# Patient Record
Sex: Female | Born: 1985 | Race: White | Hispanic: No | Marital: Married | Smoking: Never smoker
Health system: Southern US, Community
[De-identification: ages and names within clinical notes are randomized; demographics above are authoritative.]

## PROBLEM LIST (undated history)

## (undated) DIAGNOSIS — I10 Essential (primary) hypertension: Secondary | ICD-10-CM

## (undated) DIAGNOSIS — J45909 Unspecified asthma, uncomplicated: Secondary | ICD-10-CM

## (undated) DIAGNOSIS — F1010 Alcohol abuse, uncomplicated: Secondary | ICD-10-CM

## (undated) DIAGNOSIS — F419 Anxiety disorder, unspecified: Secondary | ICD-10-CM

## (undated) DIAGNOSIS — R7303 Prediabetes: Secondary | ICD-10-CM

## (undated) DIAGNOSIS — F988 Other specified behavioral and emotional disorders with onset usually occurring in childhood and adolescence: Secondary | ICD-10-CM

## (undated) DIAGNOSIS — F32A Depression, unspecified: Secondary | ICD-10-CM

## (undated) DIAGNOSIS — E669 Obesity, unspecified: Secondary | ICD-10-CM

## (undated) DIAGNOSIS — K219 Gastro-esophageal reflux disease without esophagitis: Secondary | ICD-10-CM

## (undated) DIAGNOSIS — K5900 Constipation, unspecified: Secondary | ICD-10-CM

## (undated) DIAGNOSIS — R87619 Unspecified abnormal cytological findings in specimens from cervix uteri: Secondary | ICD-10-CM

## (undated) HISTORY — PX: WISDOM TOOTH EXTRACTION: SHX21

## (undated) HISTORY — DX: Other specified behavioral and emotional disorders with onset usually occurring in childhood and adolescence: F98.8

## (undated) HISTORY — DX: Constipation, unspecified: K59.00

## (undated) HISTORY — DX: Anxiety disorder, unspecified: F41.9

## (undated) HISTORY — DX: Alcohol abuse, uncomplicated: F10.10

## (undated) HISTORY — DX: Unspecified asthma, uncomplicated: J45.909

## (undated) HISTORY — DX: Unspecified abnormal cytological findings in specimens from cervix uteri: R87.619

## (undated) HISTORY — PX: MOUTH SURGERY: SHX715

## (undated) HISTORY — DX: Depression, unspecified: F32.A

## (undated) HISTORY — DX: Essential (primary) hypertension: I10

## (undated) HISTORY — DX: Gastro-esophageal reflux disease without esophagitis: K21.9

## (undated) HISTORY — DX: Obesity, unspecified: E66.9

## (undated) HISTORY — DX: Prediabetes: R73.03

---

## 2015-06-11 HISTORY — PX: LEEP: SHX91

## 2015-06-21 ENCOUNTER — Ambulatory Visit (INDEPENDENT_AMBULATORY_CARE_PROVIDER_SITE_OTHER): Payer: 59 | Admitting: Obstetrics and Gynecology

## 2015-06-21 ENCOUNTER — Encounter: Payer: Self-pay | Admitting: Obstetrics and Gynecology

## 2015-06-21 VITALS — BP 130/80 | HR 72 | Resp 14 | Ht 66.5 in | Wt 183.0 lb

## 2015-06-21 DIAGNOSIS — Z124 Encounter for screening for malignant neoplasm of cervix: Secondary | ICD-10-CM

## 2015-06-21 DIAGNOSIS — Z01419 Encounter for gynecological examination (general) (routine) without abnormal findings: Secondary | ICD-10-CM | POA: Diagnosis not present

## 2015-06-21 DIAGNOSIS — Z3041 Encounter for surveillance of contraceptive pills: Secondary | ICD-10-CM | POA: Diagnosis not present

## 2015-06-21 MED ORDER — ENSKYCE 0.15-30 MG-MCG PO TABS: 1.0000 | ORAL_TABLET | Freq: Every day | ORAL | Status: DC

## 2015-06-21 NOTE — Progress Notes (Signed)
Patient ID: Brandi Davies, female   DOB: 1986-01-25, 30 y.o.   MRN: [id number] 30 y.o. G0P0000 MarriedCaucasianF here for annual exam.  She does state she has cramps for 1/2-1 day, helped with ibuprofen. Considering conception in the next 1-1.5 years. Sexually active, no regular pain. Occasionally gets pain in her RLQ, prior ultrasound was normal. It started as a teen, it has become much less frequent, can't remember the last time it happened.  Period Cycle (Days): 28 Period Duration (Days): 4-7 days Period Pattern: Regular Menstrual Flow: Moderate, Light Menstrual Control: Tampon Dysmenorrhea: None  Patient's last menstrual period was 05/29/2015.          Sexually active: Yes.    The current method of family planning is OCP (estrogen/progesterone).    Exercising: Yes.    cardio and light weights Smoker:  no  Health Maintenance: Pap:  2013 WNL per patient  History of abnormal Pap:  no TDaP:  2008 Gardasil: no    reports that she has never smoked. She has never used smokeless tobacco. She reports that she drinks about 4.8 oz of alcohol per week. She reports that she does not use illicit drugs. 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 Married for 1.5 years.   Past Medical History  Diagnosis Date  . Anxiety     Past Surgical History  Procedure Laterality Date  . Wisdom tooth extraction    . Mouth surgery      Current Outpatient Prescriptions  Medication Sig Dispense Refill  . ALBUTEROL SULFATE HFA IN Inhale into the lungs.    Gillian Scarce. ENSKYCE 0.15-30 MG-MCG tablet      No current facility-administered medications for this visit.    Family History  Problem Relation Age of Onset  . Stomach cancer Maternal Grandmother   . Stomach cancer Maternal Grandfather   . Stroke Paternal Grandmother   . Heart disease Paternal Grandmother   . Heart attack Paternal Grandmother   . Heart attack Paternal Grandfather   Mom died of Internal Bleeding, when the patient was [id number], she wasn't in her life.   Review of  Systems  Constitutional: Negative.   HENT: Negative.   Eyes: Negative.   Respiratory: Negative.   Cardiovascular: Negative.   Gastrointestinal: Negative.   Endocrine: Negative.   Genitourinary: Negative.   Musculoskeletal: Negative.   Skin: Negative.   Allergic/Immunologic: Negative.   Neurological: Negative.   Psychiatric/Behavioral: Negative.     Exam:   BP 130/80 mmHg  Pulse 72  Resp 14  Ht 5' 6.5" (1.689 m)  Wt 183 lb (83.008 kg)  BMI 29.10 kg/m2  LMP 05/29/2015  Weight change: @WEIGHTCHANGE @ Height:   Height: 5' 6.5" (168.9 cm)  Ht Readings from Last 3 Encounters:  06/21/15 5' 6.5" (1.689 m)    General appearance: alert, cooperative and appears stated age Head: Normocephalic, without obvious abnormality, atraumatic Neck: no adenopathy, supple, symmetrical, trachea midline and thyroid normal to inspection and palpation Lungs: clear to auscultation bilaterally Breasts: normal appearance, no masses or tenderness Heart: regular rate and rhythm Abdomen: soft, non-tender; bowel sounds normal; no masses,  no organomegaly Extremities: extremities normal, atraumatic, no cyanosis or edema Skin: Skin color, texture, turgor normal. No rashes or lesions Lymph nodes: Cervical, supraclavicular, and axillary nodes normal. No abnormal inguinal nodes palpated Neurologic: Grossly normal   Pelvic: External genitalia:  no lesions              Urethra:  normal appearing urethra with no masses, tenderness or lesions  Bartholins and Skenes: normal                 Vagina: normal appearing vagina with normal color and discharge, no lesions              Cervix: no lesions               Bimanual Exam:  Uterus:  normal size, contour, position, consistency, mobility, non-tender              Adnexa: no mass, fullness, tenderness               Rectovaginal: Confirms               Anus:  normal sphincter tone, no lesions  Chaperone was present for exam.  A:  Well Woman with  normal exam  Contraception, happy with OCP's  Considering pregnancy in the next 1-1.5 years. No FH of genetic or chromosomal abnormalities  P:   Pap  Continue OCP's  Discussed preconception health, information given  Discussed breast self exam  Discussed calcium and vit d  Declines lipids  She will confirm she has had her TDAP

## 2015-06-21 NOTE — Patient Instructions (Addendum)
EXERCISE AND DIET:  We recommended that you start or continue a regular exercise program for good health. Regular exercise means any activity that makes your heart beat faster and makes you sweat.  We recommend exercising at least 30 minutes per day at least 3 days a week, preferably 4 or 5.  We also recommend a diet low in fat and sugar.  Inactivity, poor dietary choices and obesity can cause diabetes, heart attack, stroke, and kidney damage, among others.    ALCOHOL AND SMOKING:  Women should limit their alcohol intake to no more than 7 drinks/beers/glasses of wine (combined, not each!) per week. Moderation of alcohol intake to this level decreases your risk of breast cancer and liver damage. And of course, no recreational drugs are part of a healthy lifestyle.  And absolutely no smoking or even second hand smoke. Most people know smoking can cause heart and lung diseases, but did you know it also contributes to weakening of your bones? Aging of your skin?  Yellowing of your teeth and nails?  CALCIUM AND VITAMIN D:  Adequate intake of calcium and Vitamin D are recommended.  The recommendations for exact amounts of these supplements seem to change often, but generally speaking 600 mg of calcium (either carbonate or citrate) and 800 units of Vitamin D per day seems prudent. Certain women may benefit from higher intake of Vitamin D.  If you are among these women, your doctor will have told you during your visit.    PAP SMEARS:  Pap smears, to check for cervical cancer or precancers,  have traditionally been done yearly, although recent scientific advances have shown that most women can have pap smears less often.  However, every woman still should have a physical exam from her gynecologist every year. It will include a breast check, inspection of the vulva and vagina to check for abnormal growths or skin changes, a visual exam of the cervix, and then an exam to evaluate the size and shape of the uterus and  ovaries.  And after 28 years of age, a rectal exam is indicated to check for rectal cancers. We will also provide age appropriate advice regarding health maintenance, like when you should have certain vaccines, screening for sexually transmitted diseases, bone density testing, colonoscopy, mammograms, etc.    Check if you have had a TDAP

## 2015-06-26 LAB — IPS PAP TEST WITH REFLEX TO HPV

## 2015-06-29 ENCOUNTER — Telehealth: Payer: Self-pay | Admitting: Emergency Medicine

## 2015-06-29 DIAGNOSIS — R87613 High grade squamous intraepithelial lesion on cytologic smear of cervix (HGSIL): Secondary | ICD-10-CM

## 2015-06-29 NOTE — Telephone Encounter (Signed)
-----   Message from Romualdo Bolk, MD sent at 06/27/2015 10:37 AM EST ----- Please inform and set her up for a colposcopy with me.

## 2015-06-29 NOTE — Telephone Encounter (Signed)
Message left to return call to [place] at [phone].   Patient on OCP-Ensykce. LMP at last office visit 05/27/15. 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

## 2015-06-30 ENCOUNTER — Ambulatory Visit: Payer: 59 | Admitting: Obstetrics and Gynecology

## 2015-07-03 NOTE — Telephone Encounter (Signed)
Call again to patient.  Results of pap smear and high grade cells noted on pap smear discussed with patient. Questions answered.  She is agreeable to scheduling colposcopy. Scheduled for 07/10/15 at 0815 (patient requests early morning appointment) Brief description of procedure given to patient.  Colposcopy pre-procedure instructions given. Discussed menses and need to not have any bleeding on day of appointment, advised to call to reschedule if starts cycle. On OCP, LMP last week per patient.  Make sure to eat a meal and hydrate before appointment.  Advised 800 mg of Motrin PO with food one hour prior to appointment.   Patient verbalized understanding of preprocedure instructions and cancellation policy and will call to reschedule if will be on menses or has any concerns regarding pregnancy.  Patient is advised she will be contacted with insurance coverage information.   cc Harland Dingwall for insurance pre-certification  and patient contact.   Routing to provider for final review. Patient agreeable to disposition. Will close encounter.

## 2015-07-10 ENCOUNTER — Ambulatory Visit (INDEPENDENT_AMBULATORY_CARE_PROVIDER_SITE_OTHER): Payer: 59 | Admitting: Obstetrics and Gynecology

## 2015-07-10 ENCOUNTER — Encounter: Payer: Self-pay | Admitting: Obstetrics and Gynecology

## 2015-07-10 VITALS — BP 130/80 | HR 84 | Resp 16 | Wt 181.0 lb

## 2015-07-10 DIAGNOSIS — Z01812 Encounter for preprocedural laboratory examination: Secondary | ICD-10-CM

## 2015-07-10 DIAGNOSIS — R87613 High grade squamous intraepithelial lesion on cytologic smear of cervix (HGSIL): Secondary | ICD-10-CM | POA: Diagnosis not present

## 2015-07-10 LAB — POCT URINE PREGNANCY: Preg Test, Ur: NEGATIVE

## 2015-07-10 NOTE — Progress Notes (Signed)
Patient ID: Brandi Davies, female   DOB: 09-20-1985, 30 y.o.   MRN: [id number] GYNECOLOGY  VISIT   HPI: 30 y.o.   Married  Caucasian  female   G0P0000 with Patient's last menstrual period was 06/25/2015.   here for colposcopy. 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  She is married, with her husband x 6.5 years. Last pap in 2013 was normal, no h/o abnormal paps.   GYNECOLOGIC HISTORY: Patient's last menstrual period was 06/25/2015. Contraception:OCP Menopausal hormone therapy: None         OB History    Gravida Para Term Preterm AB TAB SAB Ectopic Multiple Living           There are no active problems to display for this patient.   Past Medical History  Diagnosis Date  . Anxiety   . Asthma     Past Surgical History  Procedure Laterality Date  . Wisdom tooth extraction    . Mouth surgery      Current Outpatient Prescriptions  Medication Sig Dispense Refill  . ALBUTEROL SULFATE HFA IN Inhale into the lungs.    . ENSKYCE 0.15-30 MG-MCG tablet Take 1 tablet by mouth daily. 1 Package 0   No current facility-administered medications for this visit.     ALLERGIES: Sulfa antibiotics  Family History  Problem Relation Age of Onset  . Stomach cancer Maternal Grandmother   . Stomach cancer Maternal Grandfather   . Stroke Paternal Grandmother   . Heart disease Paternal Grandmother   . Heart attack Paternal Grandmother   . Heart attack Paternal Grandfather     Social History   Social History  . Marital Status: Married    Spouse Name: N/A  . Number of Children: N/A  . Years of Education: N/A   Occupational History  . Not on file.   Social History Main Topics  . Smoking status: Never Smoker   . Smokeless tobacco: Never Used  . Alcohol Use: 4.8 oz/week    8 Standard drinks or equivalent per week  . Drug Use: No  . Sexual Activity:    Partners: Male   Other Topics Concern  . Not on file   Social History Narrative    Review of Systems   Constitutional: Negative.   HENT: Negative.   Eyes: Negative.   Respiratory: Negative.   Cardiovascular: Negative.   Gastrointestinal: Negative.   Genitourinary: Negative.   Musculoskeletal: Negative.   Skin: Negative.   Neurological: Negative.   Endo/Heme/Allergies: Negative.   Psychiatric/Behavioral: The patient is nervous/anxious.     PHYSICAL EXAMINATION:    BP 130/80 mmHg  Pulse 84  Resp 16  Wt 181 lb (82.101 kg)  LMP 06/25/2015    General appearance: alert, cooperative and appears stated age  Pelvic: External genitalia:  no lesions              Urethra:  normal appearing urethra with no masses, tenderness or lesions              Bartholins and Skenes: normal                 Vagina: normal appearing vagina with normal color and discharge, no lesions              Cervix: no lesions  Colposcopy: satisfactory, acetowhite changes anteriorly and posteriorly, some vessel changes posteriorly. Cervical biopsies taken at 6 and 11 o'clock. ECC done. Negative lugols of the vagina.  Chaperone was present for exam.  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 All of the patient's questions were answered.  An After Visit Summary was printed and given to the patient.  In addition to the colposcopy and reviewing her abnormal pap smear, over 10 minutes was spent in counseling.

## 2015-07-10 NOTE — Patient Instructions (Signed)

## 2015-07-14 ENCOUNTER — Telehealth: Payer: Self-pay | Admitting: Emergency Medicine

## 2015-07-14 DIAGNOSIS — R87613 High grade squamous intraepithelial lesion on cytologic smear of cervix (HGSIL): Secondary | ICD-10-CM

## 2015-07-14 NOTE — Telephone Encounter (Signed)
Spoke with patient. She is given results from pathology and High grade cell changes that require LEEP with Dr. Oscar La.  She verbalizes understanding of results and importance of scheduling procedure.  She wishes to hear about insurance coverage of procedure prior to scheduling.  Advised will call her with final result from remaining biopsy as well as insurance coverage of LEEP.  Patient LMP 06/25/15.  On oral contraception.   Order placed for LEEP pre-certification.  Routing to Dr. Oscar La and Harland Dingwall

## 2015-07-14 NOTE — Telephone Encounter (Signed)
Message left to return call to [name] at [phone].    

## 2015-07-14 NOTE — Telephone Encounter (Signed)
-----   Message from Romualdo Bolk, MD sent at 07/12/2015  4:24 PM EST ----- Please inform the patient that one of the biopsies is pending. The other one is cw HSIL. Please set her up for a leep with me. The other biopsy should be back in the next 1-2 days. Don't schedule it prior to next week.

## 2015-07-18 NOTE — Telephone Encounter (Signed)
Patient spoke with business office and is now ready to schedule procedure. Menses due next week. Call to patient, left message to call back.

## 2015-07-19 NOTE — Telephone Encounter (Signed)
-----   Message from Romualdo Bolk, MD sent at 07/17/2015  1:01 PM EST ----- Please inform the patient that her final biopsy showed HPV effect, but no HSIL. She still needs a leep based on her prior procedure.

## 2015-07-19 NOTE — Telephone Encounter (Signed)
Patient advised of final biopsy results. Verbalized understanding.  She is ready to schedule LEEP procedure in office.  She states that she and Dr. Oscar La discussed possibly using Ativan in office prior to procedure. Advised patient can do so, but will need to arrive one hour earlier than scheduled time to allow for signing consent and then can take medication. She will also need a driver. Patient will discuss this with her husband and call back if she will have a ride and to make plans for pre-medication. Advised that Dr. Oscar La will use local anesthetic as well on cervix.   Patient cannot schedule appointment this week due to her schedule, plans to start her cycle on Sunday 07/23/15 and usually lasts 7-8 days. She is scheduled for procedure on 08/03/15 at 1300 with Dr. Oscar La. Pre-procedure instructions given. Motrin 800 mg PO one hour before appointment with food and water.   Patient would like to have her benefits rechecked to ensure only office visit copay is required.  Advised would send her request to insurance.   Patient agreeable and verbalized understanding of all instructions and plan of care.  Routing to provider for final review. Patient agreeable to disposition. Will close encounter.

## 2015-08-02 ENCOUNTER — Telehealth: Payer: Self-pay | Admitting: Emergency Medicine

## 2015-08-02 NOTE — Telephone Encounter (Signed)
Patient returned call. She will arrive at 12:30 for appointment tomorrow.

## 2015-08-02 NOTE — Telephone Encounter (Signed)
Call to patient. Left message with details okay per designated party release form. Asking if she can arrive tomorrow at 1230 for check in for her procedure appointment.  Asked patient to please call back and confirm.  If patient calls back please confirm that patient will arrive at 1230.

## 2015-08-03 ENCOUNTER — Encounter: Payer: Self-pay | Admitting: Obstetrics and Gynecology

## 2015-08-03 ENCOUNTER — Ambulatory Visit (INDEPENDENT_AMBULATORY_CARE_PROVIDER_SITE_OTHER): Payer: 59 | Admitting: Obstetrics and Gynecology

## 2015-08-03 VITALS — BP 144/82 | HR 72 | Resp 16 | Wt 180.0 lb

## 2015-08-03 DIAGNOSIS — Z01812 Encounter for preprocedural laboratory examination: Secondary | ICD-10-CM

## 2015-08-03 DIAGNOSIS — N762 Acute vulvitis: Secondary | ICD-10-CM | POA: Diagnosis not present

## 2015-08-03 DIAGNOSIS — R87613 High grade squamous intraepithelial lesion on cytologic smear of cervix (HGSIL): Secondary | ICD-10-CM | POA: Diagnosis not present

## 2015-08-03 LAB — POCT URINE PREGNANCY: Preg Test, Ur: NEGATIVE

## 2015-08-03 MED ORDER — BETAMETHASONE VALERATE 0.1 % EX OINT: TOPICAL_OINTMENT | CUTANEOUS | Status: DC

## 2015-08-03 NOTE — Progress Notes (Signed)
Patient ID: Brandi Davies, female   DOB: [birth year], 30 y.o.   MRN: [id number] GYNECOLOGY  VISIT   HPI: 30 y.o.   Married  Caucasian  female   G0P0000 with Patient's last menstrual period was 07/30/2015.   here for  A LEEP. The patients recent colpo with CIN 2 (at minimum), satisfactory, few atypical cells on ECC After lugols was placed the patient c/o long term issues with vulvar irritation and fissures off and on, minimal itching. Recent pap without signs of vaginitis.  GYNECOLOGIC HISTORY: Patient's last menstrual period was 07/30/2015. Contraception:OCP Menopausal hormone therapy: None        OB History    Gravida Para Term Preterm AB TAB SAB Ectopic Multiple Living           There are no active problems to display for this patient.   Past Medical History  Diagnosis Date  . Anxiety   . Asthma     Past Surgical History  Procedure Laterality Date  . Wisdom tooth extraction    . Mouth surgery      Current Outpatient Prescriptions  Medication Sig Dispense Refill  . ALBUTEROL SULFATE HFA IN Inhale into the lungs.    . ENSKYCE 0.15-30 MG-MCG tablet Take 1 tablet by mouth daily. 1 Package 0  . betamethasone valerate ointment (VALISONE) 0.1 % Apply a pea sized amount topically BID for 1-2 weeks. 15 g 0   No current facility-administered medications for this visit.     ALLERGIES: Sulfa antibiotics  Family History  Problem Relation Age of Onset  . Stomach cancer Maternal Grandmother   . Stomach cancer Maternal Grandfather   . Stroke Paternal Grandmother   . Heart disease Paternal Grandmother   . Heart attack Paternal Grandmother   . Heart attack Paternal Grandfather     Social History   Social History  . Marital Status: Married    Spouse Name: N/A  . Number of Children: N/A  . Years of Education: N/A   Occupational History  . Not on file.   Social History Main Topics  . Smoking status: Never Smoker   . Smokeless tobacco: Never Used  .  Alcohol Use: 4.8 oz/week    8 Standard drinks or equivalent per week  . Drug Use: No  . Sexual Activity:    Partners: Male   Other Topics Concern  . Not on file   Social History Narrative    Review of Systems  Constitutional: Negative.   HENT: Negative.   Eyes: Negative.   Respiratory: Negative.   Cardiovascular: Negative.   Gastrointestinal: Negative.   Genitourinary: Negative.   Musculoskeletal: Negative.   Skin: Negative.   Neurological: Negative.   Endo/Heme/Allergies: Negative.   Psychiatric/Behavioral: The patient is nervous/anxious.     PHYSICAL EXAMINATION:    BP 144/82 mmHg  Pulse 72  Resp 16  Wt 180 lb (81.647 kg)  LMP 07/30/2015    General appearance: alert, cooperative and appears stated age  Procedure: The patient was counseled as to the risks of the procedure, including: infection, bleeding, future pregnancy risks and cervical stenosis. A consent form was signed.  Colposcopy was performed, satisfactory. Lugols solution was placed on the cervix and a paracervical block was injected using 1% lidocaine with epinephrine. The 1.5 cm loop was used to remove a portion of the exocervix taking care to get the entire transformation zone.  The settings were 55 cut, 50 coag with a blend  of 1.  An ECC was performed. The cautery ball was then used to cauterize the base of the biopsy site and monsels were placed. The patient tolerated the procedure well.   After lugols was placed the patient c/o long term issues with vulvar irritation and fissures off and on, minimal itching. Lugols and acetic acid already used. No abnormal d/c was noted.  Vulva: slight whitening of the skin surrounding the clitoris, fissure noted right above the clitoris.   Chaperone was present for exam.  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

## 2015-08-10 ENCOUNTER — Telehealth: Payer: Self-pay | Admitting: Obstetrics and Gynecology

## 2015-08-10 ENCOUNTER — Encounter: Payer: Self-pay | Admitting: Obstetrics and Gynecology

## 2015-08-10 NOTE — Telephone Encounter (Signed)
Called patient, left a message to call back and speak to myself or one of the phone nurses. 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 After the ECC is done, more tissue is treated with the cautery. She needs a f/u pap and ECC in 4 months.

## 2015-08-10 NOTE — Telephone Encounter (Signed)
Patient talked personally with Dr. Oscar La this afternoon.  Dr. Oscar La addressed questions. Will close encounter.

## 2015-08-10 NOTE — Telephone Encounter (Signed)
Spoke with the patient, reviewed results. Recommended pap and ECC in 4 months.

## 2015-08-31 ENCOUNTER — Encounter: Payer: Self-pay | Admitting: Obstetrics and Gynecology

## 2015-08-31 ENCOUNTER — Ambulatory Visit (INDEPENDENT_AMBULATORY_CARE_PROVIDER_SITE_OTHER): Payer: 59 | Admitting: Obstetrics and Gynecology

## 2015-08-31 VITALS — BP 132/80 | HR 84 | Resp 16 | Wt 183.0 lb

## 2015-08-31 DIAGNOSIS — N762 Acute vulvitis: Secondary | ICD-10-CM

## 2015-08-31 DIAGNOSIS — D069 Carcinoma in situ of cervix, unspecified: Secondary | ICD-10-CM | POA: Diagnosis not present

## 2015-08-31 DIAGNOSIS — Z9889 Other specified postprocedural states: Secondary | ICD-10-CM | POA: Diagnosis not present

## 2015-08-31 NOTE — Progress Notes (Signed)
Patient ID: Brandi Davies, female   DOB: June 28, 1985, 30 y.o.   MRN: [id number] GYNECOLOGY  VISIT   HPI: 30 y.o.   Married  Caucasian  female   G0P0000 with Patient's last menstrual period was 08/23/2015.   here for recheck Vulvitis and f/u from a leep. She has stopped bleeding. She has a long h/o intermittent vulvar irritation and pruritus. She was given a steroid ointment at her last visit, she has been under stress and never ended up filling the script. She denies any abnormal bleeding, she had some bleeding and funny d/c for a few weeks, started a week after the leep.  GYNECOLOGIC HISTORY: Patient's last menstrual period was 08/23/2015. Contraception:OCP Menopausal hormone therapy: None         OB History    Gravida Para Term Preterm AB TAB SAB Ectopic Multiple Living   0 0 0 0 0 0 0 0 0 0          There are no active problems to display for this patient.   Past Medical History  Diagnosis Date  . Anxiety   . Asthma     Past Surgical History  Procedure Laterality Date  . Wisdom tooth extraction    . Mouth surgery      Current Outpatient Prescriptions  Medication Sig Dispense Refill  . ALBUTEROL SULFATE HFA IN Inhale into the lungs.    . ENSKYCE 0.15-30 MG-MCG tablet Take 1 tablet by mouth daily. 1 Package 0  . betamethasone valerate ointment (VALISONE) 0.1 % Apply a pea sized amount topically BID for 1-2 weeks. (Patient not taking: Reported on 08/31/2015) 15 g 0   No current facility-administered medications for this visit.     ALLERGIES: Sulfa antibiotics  Family History  Problem Relation Age of Onset  . Stomach cancer Maternal Grandmother   . Stomach cancer Maternal Grandfather   . Stroke Paternal Grandmother   . Heart disease Paternal Grandmother   . Heart attack Paternal Grandmother   . Heart attack Paternal Grandfather     Social History   Social History  . Marital Status: Married    Spouse Name: N/A  . Number of Children: N/A  . Years of Education: N/A    Occupational History  . Not on file.   Social History Main Topics  . Smoking status: Never Smoker   . Smokeless tobacco: Never Used  . Alcohol Use: 4.8 oz/week    8 Standard drinks or equivalent per week  . Drug Use: No  . Sexual Activity:    Partners: Male   Other Topics Concern  . Not on file   Social History Narrative    Review of Systems  Constitutional: Negative.   HENT: Negative.   Eyes: Negative.   Respiratory: Negative.   Cardiovascular: Negative.   Gastrointestinal: Negative.   Genitourinary: Negative.   Musculoskeletal: Negative.   Skin: Negative.   Neurological: Negative.   Endo/Heme/Allergies: Negative.   Psychiatric/Behavioral: Negative.     PHYSICAL EXAMINATION:    BP 132/80 mmHg  Pulse 84  Resp 16  Wt 183 lb (83.008 kg)  LMP 08/23/2015    General appearance: alert, cooperative and appears stated age  Pelvic: External genitalia:  no lesions, some erythema and fissures above the clitoris and in the posterior fourchette to the perianal area.               Urethra:  normal appearing urethra with no masses, tenderness or lesions  Bartholins and Skenes: normal                 Vagina: normal appearing vagina with a slight increase in yellow, white, thick vaginal discharge              Cervix: Healing well s/p leep. Appears slightly friable  Chaperone was present for exam.  Wet prep: no clue, no trich, ++ wbc KOH: no yeast PH: 4.5   ASSESSMENT S/P LEEP, healing well. She had CIN III with focally + margin and some atypical cells in the ECC Vulvitis, negative vaginal slides    PLAN She is returning for a pap and ECC in 3 more months. We discussed the possible need for a repeat LEEP Discussed vulvar skin care Will send wet prep probe She will start the valisone that was prescribed at her last visit Return if symptoms don't resolve She would like to get pregnant, will continue contraception until after her f/u pap and ECC    An  After Visit Summary was printed and given to the patient.  15+ minutes face to face time of which over 50% was spent in counseling.

## 2015-09-01 LAB — WET PREP BY MOLECULAR PROBE
CANDIDA SPECIES: NEGATIVE
GARDNERELLA VAGINALIS: NEGATIVE
TRICHOMONAS VAG: NEGATIVE

## 2015-10-16 ENCOUNTER — Other Ambulatory Visit: Payer: Self-pay | Admitting: Obstetrics and Gynecology

## 2015-10-16 NOTE — Telephone Encounter (Signed)
Medication refill request: Enskyce Last AEX:  06/21/15 with JJ Next AEX: 06/26/2016 with JJ  Last MMG (if hormonal medication request): n/a Refill authorized: #1 pack with 7 rfs

## 2015-12-21 ENCOUNTER — Encounter: Payer: Self-pay | Admitting: Obstetrics and Gynecology

## 2015-12-21 ENCOUNTER — Ambulatory Visit (INDEPENDENT_AMBULATORY_CARE_PROVIDER_SITE_OTHER): Payer: 59 | Admitting: Obstetrics and Gynecology

## 2015-12-21 VITALS — BP 138/80 | HR 68 | Resp 15 | Wt 189.0 lb

## 2015-12-21 DIAGNOSIS — D069 Carcinoma in situ of cervix, unspecified: Secondary | ICD-10-CM | POA: Diagnosis not present

## 2015-12-21 DIAGNOSIS — Z124 Encounter for screening for malignant neoplasm of cervix: Secondary | ICD-10-CM

## 2015-12-21 DIAGNOSIS — J4520 Mild intermittent asthma, uncomplicated: Secondary | ICD-10-CM

## 2015-12-21 DIAGNOSIS — L293 Anogenital pruritus, unspecified: Secondary | ICD-10-CM

## 2015-12-21 MED ORDER — ALBUTEROL SULFATE HFA 108 (90 BASE) MCG/ACT IN AERS: 2.0000 | INHALATION_SPRAY | Freq: Four times a day (QID) | RESPIRATORY_TRACT | Status: AC | PRN

## 2015-12-21 NOTE — Progress Notes (Addendum)
GYNECOLOGY  VISIT   HPI: 30 y.o.   Married  Caucasian  female   G0P0000 with Patient's last menstrual period was 12/13/2015.   here for follow up PAP and ECC. S/P LEEP in 2/17, CIN III with focally + margin and some atypical cells in the ECC She has sports induced asthma, occasionally gets wheezy in the humid weather. Usually fine. Always has an albuterol inhaler in case she needs it. Requesting a script.  She has gained 5 lbs since her last visit. She has been doing cardio and weights, exercises most days, weights everyday.  She has issues with chronic vulvar irritation. She has been given steroid ointment in the past which she uses prn. She is aware of vulvar skin care recommendations. She states that currently her symptoms are tolerable.   GYNECOLOGIC HISTORY: Patient's last menstrual period was 12/13/2015. Contraception:OCP Menopausal hormone therapy: none        OB History    Gravida Para Term Preterm AB TAB SAB Ectopic Multiple Living           Patient Active Problem List   Diagnosis Date Noted  . CIN III (cervical intraepithelial neoplasia grade III) with severe dysplasia 08/31/2015  . H/O LEEP 08/31/2015    Past Medical History  Diagnosis Date  . Anxiety   . Asthma     Past Surgical History  Procedure Laterality Date  . Wisdom tooth extraction    . Mouth surgery      Current Outpatient Prescriptions  Medication Sig Dispense Refill  . ALBUTEROL SULFATE HFA IN Inhale into the lungs.    . ENSKYCE 0.15-30 MG-MCG tablet TAKE 1 TABLET BY MOUTH DAILY. 28 tablet 7  . albuterol (PROVENTIL HFA;VENTOLIN HFA) 108 (90 Base) MCG/ACT inhaler Inhale 2 puffs into the lungs every 6 (six) hours as needed for wheezing or shortness of breath. 1 Inhaler 2   No current facility-administered medications for this visit.     ALLERGIES: Sulfa antibiotics  Family History  Problem Relation Age of Onset  . Stomach cancer Maternal Grandmother   . Stomach cancer  Maternal Grandfather   . Stroke Paternal Grandmother   . Heart disease Paternal Grandmother   . Heart attack Paternal Grandmother   . Heart attack Paternal Grandfather     Social History   Social History  . Marital Status: Married    Spouse Name: N/A  . Number of Children: N/A  . Years of Education: N/A   Occupational History  . Not on file.   Social History Main Topics  . Smoking status: Never Smoker   . Smokeless tobacco: Never Used  . Alcohol Use: 4.8 oz/week    8 Standard drinks or equivalent per week  . Drug Use: No  . Sexual Activity:    Partners: Male   Other Topics Concern  . Not on file   Social History Narrative    Review of Systems  Constitutional: Negative.   HENT: Negative.   Eyes: Negative.   Respiratory: Negative.   Cardiovascular: Negative.   Gastrointestinal: Negative.   Genitourinary: Negative.   Musculoskeletal: Negative.   Skin: Negative.   Neurological: Negative.   Endo/Heme/Allergies: Negative.   Psychiatric/Behavioral: Negative.     PHYSICAL EXAMINATION:    BP 138/80 mmHg  Pulse 68  Resp 15  Wt 189 lb (85.73 kg)  LMP 12/13/2015    General appearance: alert, cooperative and appears stated age Lungs: clear to ascultation bilaterally  Pelvic: External genitalia:  no lesions, she does have mild erythema of the vulva, small fissure above the clitoris              Urethra:  normal appearing urethra with no masses, tenderness or lesions              Bartholins and Skenes: normal                 Vagina: normal appearing vagina with a slight increase in white vaginal discharge on the walls of the vagina              Cervix: no lesions and healing well               Chaperone was present for exam.  ASSESSMENT H/O LEEP, focally +margin and atypical cells on ECC Difficulty with weight loss Asthma, mild, worsened with exercise and bad humidity. She states it is stable. She doesn't have a primary MD Vulvitis, chronic     PLAN Pap  and ECC, further plan based on results Discussed weight watchers, watching portion size, cutting back on ETOH (she drinks 2 bottles of wine on the weekend), continuing to exercise Will treat with albuterol. If she has worsening asthma, she will need to establish care with a primary MD Wet prep probe sent. Discussed intermittent use of steroid ointment, she can use Vaseline daily     An After Visit Summary was printed and given to the patient.  25 minutes face to face time of which over 50% was spent in counseling.

## 2015-12-22 ENCOUNTER — Telehealth: Payer: Self-pay | Admitting: Emergency Medicine

## 2015-12-22 LAB — WET PREP BY MOLECULAR PROBE
Candida species: NEGATIVE
Gardnerella vaginalis: POSITIVE — AB
TRICHOMONAS VAG: NEGATIVE

## 2015-12-22 LAB — IPS PAP TEST WITH HPV

## 2015-12-22 MED ORDER — METRONIDAZOLE 500 MG PO TABS: 500.0000 mg | ORAL_TABLET | Freq: Two times a day (BID) | ORAL | Status: DC

## 2015-12-22 NOTE — Telephone Encounter (Signed)
Message left to return call to [place] at [phone].    

## 2015-12-22 NOTE — Telephone Encounter (Signed)
-----   Message from Romualdo BolkJill Evelyn Jertson, MD sent at 12/22/2015  8:29 AM EDT ----- Please inform the patient that her vaginitis probe was + for BV and treat with flagyl (either oral or vaginal, her choice), no ETOH while on Flagyl.  Oral: Flagyl 500 mg BID x 7 days, or Vaginal: Metrogel, 1 applicator per vagina q day x 5 days.

## 2015-12-22 NOTE — Telephone Encounter (Signed)
Spoke with patient. Advised of message as seen below from Dr.Jertson. She is agreeable and verbalizes understanding. She would like to take Flagyl at this time. Rx for Flagyl 500 mg BID x 7 days #14 0RF sent to pharmacy on file. ETOH precautions given.  Routing to provider for final review. Patient agreeable to disposition. Will close encounter.

## 2016-05-16 ENCOUNTER — Other Ambulatory Visit: Payer: Self-pay | Admitting: Obstetrics and Gynecology

## 2016-05-16 NOTE — Telephone Encounter (Signed)
Medication refill request: Enskyce Last AEX:  06/21/15 JJ Next AEX: 06/26/16 JJ Last MMG (if hormonal medication request): n/a Refill authorized: 10/16/15 #[id number]. Please advise. Thank you.

## 2016-05-22 ENCOUNTER — Ambulatory Visit (INDEPENDENT_AMBULATORY_CARE_PROVIDER_SITE_OTHER): Payer: 59 | Admitting: Obstetrics and Gynecology

## 2016-05-22 ENCOUNTER — Encounter: Payer: Self-pay | Admitting: Obstetrics and Gynecology

## 2016-05-22 VITALS — BP 142/82 | HR 80 | Resp 16 | Wt 186.0 lb

## 2016-05-22 DIAGNOSIS — N760 Acute vaginitis: Secondary | ICD-10-CM

## 2016-05-22 DIAGNOSIS — N393 Stress incontinence (female) (male): Secondary | ICD-10-CM | POA: Diagnosis not present

## 2016-05-22 DIAGNOSIS — L709 Acne, unspecified: Secondary | ICD-10-CM | POA: Diagnosis not present

## 2016-05-22 DIAGNOSIS — R350 Frequency of micturition: Secondary | ICD-10-CM | POA: Diagnosis not present

## 2016-05-22 LAB — POCT URINALYSIS DIPSTICK
BILIRUBIN UA: NEGATIVE
KETONES UA: NEGATIVE
Leukocytes, UA: NEGATIVE
Nitrite, UA: NEGATIVE
PH UA: 7
PROTEIN UA: NEGATIVE
RBC UA: NEGATIVE
Urobilinogen, UA: NEGATIVE

## 2016-05-22 MED ORDER — DROSPIRENONE-ETHINYL ESTRADIOL 3-0.02 MG PO TABS: 1.0000 | ORAL_TABLET | Freq: Every day | ORAL | 0 refills | Status: DC

## 2016-05-22 NOTE — Progress Notes (Signed)
GYNECOLOGY  VISIT   HPI: 30 y.o.   Married  Caucasian  female   G0P0000 with Patient's last menstrual period was 05/05/2016.   here c/o vaginal discharge and feeling "wet", noticed a small amount of green d/c. Slight itching. She also c/o increased frequency of urination. No dysuria. If her bladder is full and she coughs or sneeze she is can leak some urine, usually small amounts, 1 x was a large amount.  She notices a lump on her labia, hard, noticed it in the last week, not tender. On the left side.  She is c/o worsening acne on her current pill. Cystic. The best week for acne is the week of her cycle.   GYNECOLOGIC HISTORY: Patient's last menstrual period was 05/05/2016. Contraception:OCP Menopausal hormone therapy: none         OB History    Gravida Para Term Preterm AB Living   0 0 0 0 0 0   SAB TAB Ectopic Multiple Live Births   0 0 0 0           Patient Active Problem List   Diagnosis Date Noted  . CIN III (cervical intraepithelial neoplasia grade III) with severe dysplasia 08/31/2015  . H/O LEEP 08/31/2015    Past Medical History:  Diagnosis Date  . Anxiety   . Asthma     Past Surgical History:  Procedure Laterality Date  . MOUTH SURGERY    . WISDOM TOOTH EXTRACTION      Current Outpatient Prescriptions  Medication Sig Dispense Refill  . albuterol (PROVENTIL HFA;VENTOLIN HFA) 108 (90 Base) MCG/ACT inhaler Inhale 2 puffs into the lungs every 6 (six) hours as needed for wheezing or shortness of breath. 1 Inhaler 2  . ENSKYCE 0.15-30 MG-MCG tablet TAKE 1 TABLET BY MOUTH  DAILY 84 tablet 0   No current facility-administered medications for this visit.      ALLERGIES: Sulfa antibiotics  Family History  Problem Relation Age of Onset  . Stomach cancer Maternal Grandmother   . Stomach cancer Maternal Grandfather   . Stroke Paternal Grandmother   . Heart disease Paternal Grandmother   . Heart attack Paternal Grandmother   . Heart attack Paternal Grandfather      Social History   Social History  . Marital status: Married    Spouse name: N/A  . Number of children: N/A  . Years of education: N/A   Occupational History  . Not on file.   Social History Main Topics  . Smoking status: Never Smoker  . Smokeless tobacco: Never Used  . Alcohol use 4.8 oz/week    8 Standard drinks or equivalent per week  . Drug use: No  . Sexual activity: Yes    Partners: Male   Other Topics Concern  . Not on file   Social History Narrative  . No narrative on file    Review of Systems  Constitutional: Negative.   HENT: Negative.   Eyes: Negative.   Respiratory: Negative.   Cardiovascular: Negative.   Gastrointestinal: Negative.   Genitourinary:       Vaginal discharge Vaginal "wetness"   Musculoskeletal: Negative.   Skin: Negative.   Neurological: Negative.   Endo/Heme/Allergies: Negative.   Psychiatric/Behavioral: Negative.     PHYSICAL EXAMINATION:    BP (!) 142/82 (BP Location: Right Arm, Patient Position: Sitting, Cuff Size: Normal)   Pulse 80   Resp 16   Wt 186 lb (84.4 kg)   LMP 05/05/2016   BMI 29.57 kg/m  General appearance: alert, cooperative and appears stated age  Pelvic: External genitalia:  no lesions, mild erythema              Urethra:  normal appearing urethra with no masses, tenderness or lesions              Bartholins and Skenes: normal                 Vagina: normal appearing vagina with normal color and discharge, no lesions              Cervix: no lesions              Perianal: +erythema, some whitening, some fissures   Chaperone was present for exam.  Wet prep: no clue, no trich, no wbc KOH: no yeast PH: 4.5   ASSESSMENT Vaginitis Perianal dermatitis Urinary frequency Worsening acne on OCP's New GSI   PLAN Urine for ua, c&s Information on kegels given Change to yaz for OCP's She has valisone ointment at home, recommended she use that 2 x a day for the next week Wet prep probe sent We  discussed causes of BV, she has a h/o prior infections. Discussed condoms to help decrease recurrence     An After Visit Summary was printed and given to the patient.  25 minutes face to face time of which over 50% was spent in counseling.

## 2016-05-22 NOTE — Addendum Note (Signed)
Addended by: Michell Heinrich'NEAL, SUMMER D on: 05/22/2016 12:24 PM   Modules accepted: Orders

## 2016-05-22 NOTE — Patient Instructions (Signed)
Kegel Exercises  The goal of Kegel exercises is to isolate and exercise your pelvic floor muscles. These muscles act as a hammock that supports the rectum, vagina, small intestine, and uterus. As the muscles weaken, the hammock sags and these organs are displaced from their normal positions. Kegel exercises can strengthen your pelvic floor muscles and help you to improve bladder and bowel control, improve sexual response, and help reduce many problems and some discomfort during pregnancy. Kegel exercises can be done anywhere and at any time.  HOW TO PERFORM KEGEL EXERCISES  1. Locate your pelvic floor muscles. To do this, squeeze (contract) the muscles that you use when you try to stop the flow of urine. You will feel a tightness in the vaginal area (women) and a tight lift in the rectal area (men and women).  2. When you begin, contract your pelvic muscles tight for 2-5 seconds, then relax them for 2-5 seconds. This is one set. Do 4-5 sets with a short pause in between.  3. Contract your pelvic muscles for 8-10 seconds, then relax them for 8-10 seconds. Do 4-5 sets. If you cannot contract your pelvic muscles for 8-10 seconds, try 5-7 seconds and work your way up to 8-10 seconds. Your goal is 4-5 sets of 10 contractions each day.  Keep your stomach, buttocks, and legs relaxed during the exercises. Perform sets of both short and long contractions. Vary your positions. Perform these contractions 3-4 times per day. Perform sets while you are:    · Lying in bed in the morning.  · Standing at lunch.  · Sitting in the late afternoon.  · Lying in bed at night.   You should do 40-50 contractions per day. Do not perform more Kegel exercises per day than recommended. Overexercising can cause muscle fatigue. Continue these exercises for for at least 15-20 weeks or as directed by your caregiver.     This information is not intended to replace advice given to you by your  health care provider. Make sure you discuss any questions you have with your health care provider.     Document Released: 05/13/2012 Document Revised: 06/17/2014 Document Reviewed: 04/16/2015  Elsevier Interactive Patient Education ©2017 [employer]

## 2016-05-23 LAB — URINALYSIS, MICROSCOPIC ONLY
Bacteria, UA: NONE SEEN [HPF]
CASTS: NONE SEEN [LPF]
CRYSTALS: NONE SEEN [HPF]
RBC / HPF: NONE SEEN RBC/HPF (ref <=2)
Squamous Epithelial / LPF: NONE SEEN [HPF] (ref <=5)
WBC UA: NONE SEEN WBC/HPF (ref <=5)
Yeast: NONE SEEN [HPF]

## 2016-05-23 LAB — WET PREP BY MOLECULAR PROBE
CANDIDA SPECIES: NEGATIVE
GARDNERELLA VAGINALIS: NEGATIVE
TRICHOMONAS VAG: NEGATIVE

## 2016-05-23 LAB — URINE CULTURE: Organism ID, Bacteria: NO GROWTH

## 2016-06-26 ENCOUNTER — Ambulatory Visit: Payer: 59 | Admitting: Obstetrics and Gynecology

## 2016-06-27 ENCOUNTER — Ambulatory Visit: Payer: 59 | Admitting: Obstetrics and Gynecology

## 2016-07-10 ENCOUNTER — Other Ambulatory Visit: Payer: Self-pay | Admitting: Obstetrics and Gynecology

## 2016-07-10 NOTE — Telephone Encounter (Signed)
Medication refill request: vestura  Last AEX:  06/21/15 JJ Next AEX: 07/11/16 JJ Last MMG (if hormonal medication request): none Refill authorized: 05/22/16 #[id number].   Will fill Rx tomorrow at [facility]

## 2016-07-11 ENCOUNTER — Ambulatory Visit (INDEPENDENT_AMBULATORY_CARE_PROVIDER_SITE_OTHER): Payer: 59 | Admitting: Obstetrics and Gynecology

## 2016-07-11 ENCOUNTER — Encounter: Payer: Self-pay | Admitting: Obstetrics and Gynecology

## 2016-07-11 VITALS — BP 134/80 | HR 76 | Resp 15 | Ht 67.0 in | Wt 186.0 lb

## 2016-07-11 DIAGNOSIS — F411 Generalized anxiety disorder: Secondary | ICD-10-CM

## 2016-07-11 DIAGNOSIS — Z3009 Encounter for other general counseling and advice on contraception: Secondary | ICD-10-CM | POA: Diagnosis not present

## 2016-07-11 DIAGNOSIS — Z124 Encounter for screening for malignant neoplasm of cervix: Secondary | ICD-10-CM | POA: Diagnosis not present

## 2016-07-11 DIAGNOSIS — Z9889 Other specified postprocedural states: Secondary | ICD-10-CM | POA: Diagnosis not present

## 2016-07-11 DIAGNOSIS — Z01419 Encounter for gynecological examination (general) (routine) without abnormal findings: Secondary | ICD-10-CM

## 2016-07-11 DIAGNOSIS — Z87410 Personal history of cervical dysplasia: Secondary | ICD-10-CM | POA: Diagnosis not present

## 2016-07-11 DIAGNOSIS — Z3041 Encounter for surveillance of contraceptive pills: Secondary | ICD-10-CM | POA: Diagnosis not present

## 2016-07-11 MED ORDER — DROSPIRENONE-ETHINYL ESTRADIOL 3-0.02 MG PO TABS: 1.0000 | ORAL_TABLET | Freq: Every day | ORAL | 3 refills | Status: DC

## 2016-07-11 MED ORDER — CITALOPRAM HYDROBROMIDE 10 MG PO TABS: 10.0000 mg | ORAL_TABLET | Freq: Every day | ORAL | 1 refills | Status: DC

## 2016-07-11 NOTE — Addendum Note (Signed)
Addended by: Tobi BastosJERTSON, Erva Koke E on: 07/11/2016 04:16 PM   Modules accepted: Orders

## 2016-07-11 NOTE — Patient Instructions (Signed)

## 2016-07-11 NOTE — Progress Notes (Addendum)
31 y.o. G0P0000 MarriedCaucasianF here for annual exam.   S/P LEEP in 2/17, CIN III with focally + margins and atypical cells in the ECC. F/u pap in 7/17 with ASCUS, negative hpv.  She is considering a pregnancy in the next year. She has a brother with Downs syndrome, her mom was [id number] when she had him. No other chromosomal abnormalities in her family. No genetic diseases that she is aware of.  She c/o increased anxiety, worse recently. Work is stressful. Works with good people. She tried Zoloft in the past, didn't like it, felt a lack of emotion.  Period Cycle (Days): 28 Period Duration (Days): 4 days  Period Pattern: Regular Menstrual Flow: Light, Moderate Menstrual Control: Tampon Menstrual Control Change Freq (Hours): changes tampon 3 x a day  Dysmenorrhea: (!) Severe Dysmenorrhea Symptoms: Cramping  Just changed to Yaz, had bad cramps this cycle, not typical for her. Ibuprofen helped.   Patient's last menstrual period was 07/04/2016.          Sexually active: Yes.    The current method of family planning is OCP (estrogen/progesterone).    Exercising: Yes.    weights/ cardio Smoker:  no  Health Maintenance: Pap:  12-21-15 ASCUS NEG HR HPV History of abnormal Pap:  Yes. LEEP in 2/17 for CIN III TDaP:  Unsure  Gardasil: no    reports that she has never smoked. She has never used smokeless tobacco. She reports that she drinks about 6.0 oz of alcohol per week . She reports that she does not use drugs. 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 Married for 2.5 years. Drinking varies with stress, can not drink all week, or can have 2 drinks a night.  Past Medical History:  Diagnosis Date  . Abnormal Pap smear of cervix   . Anxiety   . Asthma     Past Surgical History:  Procedure Laterality Date  . MOUTH SURGERY    . WISDOM TOOTH EXTRACTION      Current Outpatient Prescriptions  Medication Sig Dispense Refill  . albuterol (PROVENTIL HFA;VENTOLIN  HFA) 108 (90 Base) MCG/ACT inhaler Inhale 2 puffs into the lungs every 6 (six) hours as needed for wheezing or shortness of breath. 1 Inhaler 2  . drospirenone-ethinyl estradiol (YAZ,GIANVI,LORYNA) 3-0.02 MG tablet Take 1 tablet by mouth daily. 3 Package 0   No current facility-administered medications for this visit.     Family History  Problem Relation Age of Onset  . Stomach cancer Maternal Grandmother   . Stomach cancer Maternal Grandfather   . Stroke Paternal Grandmother   . Heart disease Paternal Grandmother   . Heart attack Paternal Grandmother   . Heart attack Paternal Grandfather     Review of Systems  Constitutional: Negative.   HENT: Negative.   Eyes: Negative.   Respiratory: Negative.   Cardiovascular: Negative.   Gastrointestinal: Negative.   Endocrine: Negative.   Genitourinary: Positive for menstrual problem.       Last menstrual cycle very painful  Musculoskeletal: Negative.   Skin: Negative.   Allergic/Immunologic: Negative.   Neurological: Negative.   Psychiatric/Behavioral: Negative.     Exam:   BP 134/80 (BP Location: Right Arm, Patient Position: Sitting, Cuff Size: Normal)   Pulse 76   Resp 15   Ht 5\' 7"  (1.702 m)   Wt 186 lb (84.4 kg)   LMP 07/04/2016   BMI 29.13 kg/m   Weight change: @WEIGHTCHANGE @ Height:   Height: 5'  7" (170.2 cm)  Ht Readings from Last 3 Encounters:  07/11/16 5\' 7"  (1.702 m)  06/21/15 5' 6.5" (1.689 m)    General appearance: alert, cooperative and appears stated age Head: Normocephalic, without obvious abnormality, atraumatic Neck: no adenopathy, supple, symmetrical, trachea midline and thyroid normal to inspection and palpation Lungs: clear to auscultation bilaterally Cardiovascular: regular rate and rhythm Breasts: normal appearance, no masses or tenderness Heart: regular rate and rhythm Abdomen: soft, non-tender; bowel sounds normal; no masses,  no organomegaly Extremities: extremities normal, atraumatic, no cyanosis  or edema Skin: Skin color, texture, turgor normal. No rashes or lesions Lymph nodes: Cervical, supraclavicular, and axillary nodes normal. No abnormal inguinal nodes palpated Neurologic: Grossly normal   Pelvic: External genitalia:  no lesions              Urethra:  normal appearing urethra with no masses, tenderness or lesions              Bartholins and Skenes: normal                 Vagina: normal appearing vagina with normal color and discharge, no lesions              Cervix: no lesions               Bimanual Exam:  Uterus:  normal size, contour, position, consistency, mobility, non-tender              Adnexa: no mass, fullness, tenderness               Rectovaginal: Confirms               Anus:  normal sphincter tone, no lesions  Chaperone was present for exam.  A:  Well Woman with normal exam  Discussed pregnancy, discussed risks of chromosomal abnormalities  H/O CIN III s/p leep 2/17, + margin. Pap last summer was ASCUS, -hpv  Anxiety, worsening  Dysmenorrhea    P:   Pap with hpv  Continue OCP  Start PNV prior to going off of the pill  Start a very low dose of Celexa   Names and #'s of counselors and primary MD's given  Lipids normal at work in the last year  Declines screening blood work

## 2016-07-19 LAB — IPS PAP TEST WITH HPV

## 2016-07-23 ENCOUNTER — Telehealth: Payer: Self-pay

## 2016-07-23 DIAGNOSIS — R87610 Atypical squamous cells of undetermined significance on cytologic smear of cervix (ASC-US): Secondary | ICD-10-CM

## 2016-07-23 NOTE — Telephone Encounter (Signed)
Spoke with patient. Advised of results as seen below from Dr.Jertson. Patient is agreeable and verbalizes understanding. Patient is still taking Yaz OCP. Has not missed pills or taken any pills late. Would like to proceed with scheduling colposcopy. Appointment scheduled for 07/30/2016 at 9 am with Dr.Jertson. Patient is agreeable to date and time.  Instructions given. Motrin 800 mg po x , one hour before appointment with food. Make sure to eat a meal before appointment and drink plenty of fluids. Patient verbalized understanding and will call to reschedule if will be on menses or has any concerns regarding pregnancy. Patient agreeable and verbalized understanding of all instructions. Order placed for precert.  Routing to provider for final review. Patient agreeable to disposition. Will close encounter.

## 2016-07-23 NOTE — Telephone Encounter (Signed)
-----   Message from Romualdo BolkJill Evelyn Jertson, MD sent at 07/20/2016  9:35 AM EST ----- Please inform the patient that her pap returned again as ASCUS, negative HPV. Because of her prior LEEP last year and persistent ASCUS, we should do another colposcopy. Her ECC from 6 months ago was negative, that and the negative hpv are very reassuring. We should still do another colposcopy. Please schedule.

## 2016-07-30 ENCOUNTER — Ambulatory Visit: Payer: 59 | Admitting: Obstetrics and Gynecology

## 2016-08-01 ENCOUNTER — Ambulatory Visit: Payer: 59 | Admitting: Obstetrics and Gynecology

## 2016-08-12 ENCOUNTER — Ambulatory Visit (INDEPENDENT_AMBULATORY_CARE_PROVIDER_SITE_OTHER): Payer: 59 | Admitting: Obstetrics and Gynecology

## 2016-08-12 ENCOUNTER — Encounter: Payer: Self-pay | Admitting: Obstetrics and Gynecology

## 2016-08-12 VITALS — BP 138/86 | HR 72 | Resp 14 | Wt 183.0 lb

## 2016-08-12 DIAGNOSIS — F411 Generalized anxiety disorder: Secondary | ICD-10-CM | POA: Diagnosis not present

## 2016-08-12 DIAGNOSIS — Z3041 Encounter for surveillance of contraceptive pills: Secondary | ICD-10-CM

## 2016-08-12 DIAGNOSIS — N912 Amenorrhea, unspecified: Secondary | ICD-10-CM

## 2016-08-12 LAB — POCT URINE PREGNANCY: PREG TEST UR: NEGATIVE

## 2016-08-12 MED ORDER — CITALOPRAM HYDROBROMIDE 20 MG PO TABS: 20.0000 mg | ORAL_TABLET | Freq: Every day | ORAL | 1 refills | Status: DC

## 2016-08-12 NOTE — Progress Notes (Signed)
GYNECOLOGY  VISIT   HPI: 31 y.o.   Married  Caucasian  female   G0P0000 with Patient's last menstrual period was 07/04/2016.   here for follow up anxiety. She was started on 10 mg of Celexa last month. Initially she felt better, over the last 2 weeks she is getting a little more anxious. 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  She is on her 3rd pack of Yaz. Didn't have a cycle last month. No missed or late pills.  She feels less hungry on the Celexa, sometimes forgets to eat and gets nausea. As long as she eats she feels better.     GYNECOLOGIC HISTORY: Patient's last menstrual period was 07/04/2016. Contraception:OCP  Menopausal hormone therapy: none        OB History    Gravida Para Term Preterm AB Living   0 0 0 0 0 0   SAB TAB Ectopic Multiple Live Births   0 0 0 0           Patient Active Problem List   Diagnosis Date Noted  . CIN III (cervical intraepithelial neoplasia grade III) with severe dysplasia 08/31/2015  . H/O LEEP 08/31/2015    Past Medical History:  Diagnosis Date  . Abnormal Pap smear of cervix   . Anxiety   . Asthma     Past Surgical History:  Procedure Laterality Date  . MOUTH SURGERY    . WISDOM TOOTH EXTRACTION      Current Outpatient Prescriptions  Medication Sig Dispense Refill  . albuterol (PROVENTIL HFA;VENTOLIN HFA) 108 (90 Base) MCG/ACT inhaler Inhale 2 puffs into the lungs every 6 (six) hours as needed for wheezing or shortness of breath. 1 Inhaler 2  . citalopram (CELEXA) 10 MG tablet Take 1 tablet (10 mg total) by mouth daily. 30 tablet 1  . drospirenone-ethinyl estradiol (YAZ,GIANVI,LORYNA) 3-0.02 MG tablet Take 1 tablet by mouth daily. 3 Package 3   No current facility-administered medications for this visit.      ALLERGIES: Sulfa antibiotics  Family History  Problem Relation Age of Onset  . Stomach cancer Maternal Grandmother   . Stomach cancer Maternal Grandfather   . Stroke Paternal Grandmother   . Heart disease  Paternal Grandmother   . Heart attack Paternal Grandmother   . Heart attack Paternal Grandfather     Social History   Social History  . Marital status: Married    Spouse name: N/A  . Number of children: N/A  . Years of education: N/A   Occupational History  . Not on file.   Social History Main Topics  . Smoking status: Never Smoker  . Smokeless tobacco: Never Used  . Alcohol use 6.0 oz/week    10 Standard drinks or equivalent per week  . Drug use: No  . Sexual activity: Yes    Partners: Male    Birth control/ protection: Pill   Other Topics Concern  . Not on file   Social History Narrative  . No narrative on file    Review of Systems  Constitutional: Negative.   HENT: Negative.   Eyes: Negative.   Respiratory: Negative.   Cardiovascular: Negative.   Gastrointestinal: Positive for nausea.  Genitourinary:       Amenorrhea    Musculoskeletal: Negative.   Skin: Negative.   Neurological: Positive for headaches.  Endo/Heme/Allergies: Negative.   Psychiatric/Behavioral: Negative.     PHYSICAL EXAMINATION:    BP 138/86 (BP Location: Right Arm, Patient Position: Sitting, Cuff Size:  Normal)   Pulse 72   Resp 14   Wt 183 lb (83 kg)   LMP 07/04/2016   BMI 28.66 kg/m     General appearance: alert, cooperative and appears stated age   ASSESSMENT Anxiety, improved, still not great. Only on 10 mg a day No cycle on new OCP Recent ASUS pap, negative HPV, h/o LEEP in 2/17 with focally + margins and [facility]    PLAN Will increase her dose of Celexa to 20 mg a day UPT negative Discussed that as long as she is taking her pills at the same time daily, the risk of pregnancy is very low. Discussed that it can be normal to not have her cycles on OCP's If she is tolerating it she will call for refills.  She is f/u for a colposcopy later this week    An After Visit Summary was printed and given to the patient.  15 minutes face to face time of which  over 50% was spent in counseling.

## 2016-08-15 ENCOUNTER — Ambulatory Visit (INDEPENDENT_AMBULATORY_CARE_PROVIDER_SITE_OTHER): Payer: 59 | Admitting: Obstetrics and Gynecology

## 2016-08-15 ENCOUNTER — Encounter: Payer: Self-pay | Admitting: Obstetrics and Gynecology

## 2016-08-15 VITALS — BP 140/88 | HR 84 | Resp 16 | Wt 184.0 lb

## 2016-08-15 DIAGNOSIS — R87610 Atypical squamous cells of undetermined significance on cytologic smear of cervix (ASC-US): Secondary | ICD-10-CM | POA: Diagnosis not present

## 2016-08-15 DIAGNOSIS — Z01812 Encounter for preprocedural laboratory examination: Secondary | ICD-10-CM

## 2016-08-15 DIAGNOSIS — Z9889 Other specified postprocedural states: Secondary | ICD-10-CM

## 2016-08-15 LAB — POCT URINE PREGNANCY: Preg Test, Ur: NEGATIVE

## 2016-08-15 NOTE — Patient Instructions (Signed)

## 2016-08-15 NOTE — Progress Notes (Signed)
GYNECOLOGY  VISIT   HPI: 31 y.o.   Married  Caucasian  female   G0P0000 with Patient's last menstrual period was 07/04/2016.   here for colposcopy  S/P LEEP in 2/17, CIN III with focally + margins and atypical cells in the ECC. F/u pap in 7/17 with ASCUS, negative hpv. Recent pap with ASUS, negative hpv.  GYNECOLOGIC HISTORY: Patient's last menstrual period was 07/04/2016. Contraception:OCP Menopausal hormone therapy: none         OB History    Gravida Para Term Preterm AB Living   0 0 0 0 0 0   SAB TAB Ectopic Multiple Live Births   0 0 0 0           Patient Active Problem List   Diagnosis Date Noted  . CIN III (cervical intraepithelial neoplasia grade III) with severe dysplasia 08/31/2015  . H/O LEEP 08/31/2015    Past Medical History:  Diagnosis Date  . Abnormal Pap smear of cervix   . Anxiety   . Asthma     Past Surgical History:  Procedure Laterality Date  . MOUTH SURGERY    . WISDOM TOOTH EXTRACTION      Current Outpatient Prescriptions  Medication Sig Dispense Refill  . albuterol (PROVENTIL HFA;VENTOLIN HFA) 108 (90 Base) MCG/ACT inhaler Inhale 2 puffs into the lungs every 6 (six) hours as needed for wheezing or shortness of breath. 1 Inhaler 2  . citalopram (CELEXA) 20 MG tablet Take 1 tablet (20 mg total) by mouth daily. 30 tablet 1  . drospirenone-ethinyl estradiol (YAZ,GIANVI,LORYNA) 3-0.02 MG tablet Take 1 tablet by mouth daily. 3 Package 3   No current facility-administered medications for this visit.      ALLERGIES: Sulfa antibiotics  Family History  Problem Relation Age of Onset  . Stomach cancer Maternal Grandmother   . Stomach cancer Maternal Grandfather   . Stroke Paternal Grandmother   . Heart disease Paternal Grandmother   . Heart attack Paternal Grandmother   . Heart attack Paternal Grandfather     Social History   Social History  . Marital status: Married    Spouse name: N/A  . Number of children: N/A  . Years of education: N/A    Occupational History  . Not on file.   Social History Main Topics  . Smoking status: Never Smoker  . Smokeless tobacco: Never Used  . Alcohol use 6.0 oz/week    10 Standard drinks or equivalent per week  . Drug use: No  . Sexual activity: Yes    Partners: Male    Birth control/ protection: Pill   Other Topics Concern  . Not on file   Social History Narrative  . No narrative on file    Review of Systems  Constitutional: Negative.   HENT: Negative.   Eyes: Negative.   Respiratory: Negative.   Cardiovascular: Negative.   Gastrointestinal: Negative.   Genitourinary: Negative.   Musculoskeletal: Negative.   Skin: Negative.   Neurological: Negative.   Endo/Heme/Allergies: Negative.   Psychiatric/Behavioral: Negative.     PHYSICAL EXAMINATION:    BP 140/88 (BP Location: Right Arm, Patient Position: Sitting, Cuff Size: Normal)   Pulse 84   Resp 16   Wt 184 lb (83.5 kg)   LMP 07/04/2016   BMI 28.82 kg/m     General appearance: alert, cooperative and appears stated age  Pelvic: External genitalia:  no lesions              Urethra:  normal appearing  urethra with no masses, tenderness or lesions              Bartholins and Skenes: normal                 Vagina: normal appearing vagina with normal color and discharge, no lesions              Cervix: no lesions   Colposcopy: unsatisfactory, minimal aceto-white changes at 7 o'clock, biopsy done. ECC done. Negative lugols examination of the cervix and upper vagina.              Chaperone was present for exam.  ASSESSMENT Abnormal pap, h/o LEEp    PLAN Colposcopy with biopsy and ECC Further plans based on results   An After Visit Summary was printed and given to the patient.

## 2016-09-02 ENCOUNTER — Telehealth: Payer: Self-pay | Admitting: Obstetrics and Gynecology

## 2016-09-02 MED ORDER — DROSPIRENONE-ETHINYL ESTRADIOL 3-0.02 MG PO TABS: 1.0000 | ORAL_TABLET | Freq: Every day | ORAL | 0 refills | Status: DC

## 2016-09-02 NOTE — Telephone Encounter (Signed)
Spoke with patient, advised as seen below per Dr. Oscar LaJertson. Advised patient to use BUM for 7 days. Patient verbalizes understanding and is agreeable.  Routing to provider for final review. Patient is agreeable to disposition. Will close encounter.

## 2016-09-02 NOTE — Telephone Encounter (Signed)
1 pack sent to the local pharmacy. Please inform the patient and have her double up on her pills today.

## 2016-09-02 NOTE — Telephone Encounter (Signed)
Unable to leave message,mailbox full. 

## 2016-09-02 NOTE — Telephone Encounter (Signed)
Medication refill request: Drospirenone-Ethinyl Estradiol Last AEX:  07/11/16 JJ Next AEX: 07/16/17 JJ Last MMG (if hormonal medication request): n/a Refill authorized: Prescription sent to [facility]. Patient called to fill and was told they did not have it. I spoke to pharmacist and they found the prescription and will send out in 3-5 business days. Patient states she should have started pack of pills yesterday. Can we please send 1 pack to local pharmacy on file for her to pick up today? Please advise. Thank you.

## 2016-09-02 NOTE — Telephone Encounter (Signed)
Patient is returning a call to Jill H. °

## 2016-09-02 NOTE — Telephone Encounter (Signed)
Patient is calling to get an [facility]. She states her mail order pharmacy does not have the prescription on file. [facility] has filled this before but now states they do not have the prescription. She is completed out of her birth control.

## 2016-09-06 ENCOUNTER — Other Ambulatory Visit: Payer: Self-pay | Admitting: Obstetrics and Gynecology

## 2016-09-09 MED ORDER — CITALOPRAM HYDROBROMIDE 20 MG PO TABS: 20.0000 mg | ORAL_TABLET | Freq: Every day | ORAL | 3 refills | Status: DC

## 2016-09-09 NOTE — Telephone Encounter (Signed)
Please call and check on Brandi Davies, see how she is feeling on the Celexa and if she is taking the full dose. If she is doing well on it, please call in 90 tabs with 3 refills. If not please let me know.

## 2016-09-09 NOTE — Telephone Encounter (Signed)
Spoke with patient and she is doing well on the Celexa . Sent in #90 with refills to [facility] per patients request -eh

## 2016-09-09 NOTE — Telephone Encounter (Signed)
Medication refill request: Citalopram Last AEX:  07/11/16 JJ Next AEX: 07/16/17 JJ Last MMG (if hormonal medication request): n/a Refill authorized: 08/12/16 #30 1R. Please advise. Thank you.

## 2017-07-16 ENCOUNTER — Other Ambulatory Visit: Payer: Self-pay

## 2017-07-16 ENCOUNTER — Encounter: Payer: Self-pay | Admitting: Obstetrics and Gynecology

## 2017-07-16 ENCOUNTER — Other Ambulatory Visit (HOSPITAL_COMMUNITY)
Admission: RE | Admit: 2017-07-16 | Discharge: 2017-07-16 | Disposition: A | Discharge status: Home / Self Care | Payer: 59 | Source: Ambulatory Visit | Attending: Obstetrics and Gynecology | Admitting: Obstetrics and Gynecology

## 2017-07-16 ENCOUNTER — Ambulatory Visit: Payer: 59 | Admitting: Obstetrics and Gynecology

## 2017-07-16 VITALS — BP 132/82 | HR 80 | Resp 14 | Ht 66.5 in | Wt 200.0 lb

## 2017-07-16 DIAGNOSIS — Z01419 Encounter for gynecological examination (general) (routine) without abnormal findings: Secondary | ICD-10-CM | POA: Diagnosis not present

## 2017-07-16 DIAGNOSIS — Z124 Encounter for screening for malignant neoplasm of cervix: Secondary | ICD-10-CM

## 2017-07-16 DIAGNOSIS — Z23 Encounter for immunization: Secondary | ICD-10-CM | POA: Diagnosis not present

## 2017-07-16 MED ORDER — CITALOPRAM HYDROBROMIDE 20 MG PO TABS: 20.0000 mg | ORAL_TABLET | Freq: Every day | ORAL | 3 refills | Status: DC

## 2017-07-16 NOTE — Progress Notes (Signed)
32 y.o. G0P0000 MarriedCaucasianF here for annual exam.  She is planning to get pregnant in the next year. She is off OCP's. First few cycles off of the pill were unpleasant, last few have been fine. Range from 26-34 days.  Occasional deep dyspareunia, positional. Currently using rhythm method of contraception at the moment. She is on PNV.  Period Cycle (Days): 30 Period Duration (Days): 5 days Period Pattern: Regular Menstrual Flow: Moderate, Light Menstrual Control: Tampon Menstrual Control Change Freq (Hours): changes tampon every 4 hours  Dysmenorrhea: (!) Mild Dysmenorrhea Symptoms: Cramping  Patient's last menstrual period was 07/11/2017.          Sexually active: Yes.    The current method of family planning is none.    Exercising: Yes.    cardio/ weights/ HIIT/ yoga Smoker:  no  Health Maintenance: Pap:  07-11-16 ASCUS NEG HR HPV/ 12-21-15 ASCUS NEG HR HPV 06-21-15 HSIL  History of abnormal Pap:  Yes has had colposcopy 2017 + HPV Effect- had LEEP 2017, colpo in 3/18 with negative biopsies.  TDaP:  unsure Gardasil: No   reports that  has never smoked. she has never used smokeless tobacco. She reports that she drinks about 4.2 oz of alcohol per week. She reports that she does not use drugs. She is a Advice worker, has her masters. Working for the [employer].   Past Medical History:  Diagnosis Date  . Abnormal Pap smear of cervix   . Anxiety   . Asthma     Past Surgical History:  Procedure Laterality Date  . MOUTH SURGERY    . WISDOM TOOTH EXTRACTION      Current Outpatient Medications  Medication Sig Dispense Refill  . albuterol (PROVENTIL HFA;VENTOLIN HFA) 108 (90 Base) MCG/ACT inhaler Inhale 2 puffs into the lungs every 6 (six) hours as needed for wheezing or shortness of breath. 1 Inhaler 2  . citalopram (CELEXA) 20 MG tablet Take 1 tablet (20 mg total) by mouth daily. 90 tablet 3   No current facility-administered medications for this visit.     Family  History  Problem Relation Age of Onset  . Stomach cancer Maternal Grandmother   . Stomach cancer Maternal Grandfather   . Stroke Paternal Grandmother   . Heart disease Paternal Grandmother   . Heart attack Paternal Grandmother   . Heart attack Paternal Grandfather     Review of Systems  Constitutional: Negative.   HENT: Negative.   Eyes: Negative.   Respiratory: Negative.   Cardiovascular: Negative.   Gastrointestinal: Positive for constipation.  Endocrine: Negative.   Genitourinary: Negative.   Musculoskeletal: Negative.   Skin: Negative.   Allergic/Immunologic: Negative.   Neurological: Negative.   Psychiatric/Behavioral: Negative.     Exam:   BP 132/82 (BP Location: Right Arm, Patient Position: Sitting, Cuff Size: Normal)   Pulse 80   Resp 14   Ht 5' 6.5" (1.689 m)   Wt 200 lb (90.7 kg)   LMP 07/11/2017   BMI 31.80 kg/m   Weight change: @WEIGHTCHANGE @ Height:   Height: 5' 6.5" (168.9 cm)  Ht Readings from Last 3 Encounters:  07/16/17 5' 6.5" (1.689 m)  07/11/16 5\' 7"  (1.702 m)  06/21/15 5' 6.5" (1.689 m)    General appearance: alert, cooperative and appears stated age Head: Normocephalic, without obvious abnormality, atraumatic Neck: no adenopathy, supple, symmetrical, trachea midline and thyroid normal to inspection and palpation Lungs: clear to auscultation bilaterally Cardiovascular: regular rate and rhythm Breasts: normal appearance, no masses or  tenderness Abdomen: soft, non-tender; non distended,  no masses,  no organomegaly Extremities: extremities normal, atraumatic, no cyanosis or edema Skin: Skin color, texture, turgor normal. No rashes or lesions Lymph nodes: Cervical, supraclavicular, and axillary nodes normal. No abnormal inguinal nodes palpated Neurologic: Grossly normal   Pelvic: External genitalia:  no lesions              Urethra:  normal appearing urethra with no masses, tenderness or lesions              Bartholins and Skenes: normal                  Vagina: normal appearing vagina with normal color and discharge, no lesions              Cervix: no lesions               Bimanual Exam:  Uterus:  normal size, contour, position, consistency, mobility, non-tender              Adnexa: no mass, fullness, tenderness               Rectovaginal: Confirms               Anus:  normal sphincter tone, no lesions  Chaperone was present for exam.  A:  Well Woman with normal exam  Prepregnancy planning   P:   Pap with hpv  Screening labs through work, she will send a copy  TDAP  She is on PNV  Discussed rhythm method of contraception  Discussed breast self exam  Discussed calcium and vit D intake

## 2017-07-16 NOTE — Patient Instructions (Addendum)
Natural Family Planning Introduction Natural Family Planning (NFP) is a type of birth control without using any form of contraception. Women who use NFP should not have sexual intercourse when the ovary produces an egg (ovulation) during the menstrual cycle. The NFP method is safe and can prevent pregnancy. It is 75% effective when practiced right. The man needs to also understand this method of birth control and the woman needs to be aware of how her body functions during her menstrual cycle. NFP can also be used as a method of getting pregnant. HOW THE NFP METHOD WORKS  A woman's menstrual period usually happens every 28-30 days (it can vary from 23-35 days).  Ovulation happens 12-14 days before the start of the next menstrual period (the fertile period). The egg is fertile for 24 hours and the sperm can live for 3 days or more. If there is sexual intercourse at this time, pregnancy can occur. THERE ARE MANY TYPES OF NFP METHODS USED TO PREVENT PREGNANCY  The basal body temperature method. Often times, there is a slight increase of body temperature when a woman ovulates. Take your temperature every morning before getting out of bed. Write the temperature on a chart. An increase in the temperature shows ovulation has happened. Do not have sexual intercourse from the menstrual period up to three days after the increase in the temperature. Note that the body temperature may increase as a result of fever, restless sleep, and working schedules.  The ovulation cervical mucus method. During the menstrual cycle, the cervical mucus changes from dry and sticky to wet and slippery. Check the mucus of the vagina every day to look for these changes. Just before ovulation, the mucus becomes wet and slippery. On the last day of wetness, ovulation happens. To avoid getting pregnant, sexual intercourse is safe for about 10 days after the menstrual period and on the dry mucus days. Do not have sexual intercourse when  the mucus starts to show up and not until 4 days after the wet and slippery mucus goes away. Sexual intercourse after the 4 days have passed until the menstrual period starts is a safe time. Note that the mucus from the vagina can increase because of a vaginal or cervical infection, lubricants, some medicines, and sexual excitement.  The symptothermal method. This method uses both the temperature and the ovulation methods. Combine the two methods above to prevent pregnancy.  The calendar method. Record your menstrual periods and length of the cycles for 6 months. This is helpful when the menstrual cycle varies in the length of the cycle. The length of a menstrual cycle is from day 1 of the present menstrual period to day 1 of the next menstrual period. Then, find your fertile days of the month and do not have sexual intercourse during that time. You may need help from your health care provider to find out your fertile days. There are some signs of ovulation that may be helpful when trying to find the time of ovulation. This includes vaginal spotting or abdominal cramps during the middle of your menstrual cycle. Not all women have these symptoms. YOU SHOULD NOT USE NFP IF:  You have very irregular menstrual periods and may skip months.  You have abnormal bleeding.  You have a vaginal or cervical infection.  You are on medicines that can affect the vaginal mucus or body temperature. These medicines include antibiotics, thyroid medicines, and antihistamines (cold and allergy medicine). This information is not intended to replace advice given   to you by your health care provider. Make sure you discuss any questions you have with your health care provider. Document Released: 11/13/2007 Document Revised: 11/02/2015 Document Reviewed: 11/27/2012 Elsevier Interactive Patient Education  2017 [employer]  Preparing for Pregnancy If you are considering becoming pregnant, make an appointment to see your  regular health care provider to learn how to prepare for a safe and healthy pregnancy (preconception care). During a preconception care visit, your health care provider will:  Do a complete physical exam, including a Pap test.  Take a complete medical history.  Give you information, answer your questions, and help you resolve problems.  Preconception checklist Medical history  Tell your health care provider about any current or past medical conditions. Your pregnancy or your ability to become pregnant may be affected by chronic conditions, such as diabetes, chronic hypertension, and thyroid problems.  Include your family's medical history as well as your partner's medical history.  Tell your health care provider about any history of STIs (sexually transmitted infections).These can affect your pregnancy. In some cases, they can be passed to your baby. Discuss any concerns that you have about STIs.  If indicated, discuss the benefits of genetic testing. This testing will show whether there are any genetic conditions that may be passed from you or your partner to your baby.  Tell your health care provider about: ? Any problems you have had with conception or pregnancy. ? Any medicines you take. These include vitamins, herbal supplements, and over-the-counter medicines. ? Your history of immunizations. Discuss any vaccinations that you may need.  Diet  Ask your health care provider what to include in a healthy diet that has a balance of nutrients. This is especially important when you are pregnant or preparing to become pregnant.  Ask your health care provider to help you reach a healthy weight before pregnancy. ? If you are overweight, you may be at higher risk for certain complications, such as high blood pressure, diabetes, and preterm birth. ? If you are underweight, you are more likely to have a baby who has a low birth weight.  Lifestyle, work, and home  Let your health care  provider know: ? About any lifestyle habits that you have, such as alcohol use, drug use, or smoking. ? About recreational activities that may put you at risk during pregnancy, such as downhill skiing and certain exercise programs. ? Tell your health care provider about any international travel, especially any travel to places with an active [place] virus outbreak. ? About harmful substances that you may be exposed to at work or at home. These include chemicals, pesticides, radiation, or even litter boxes. ? If you do not feel safe at home.  Mental health  Tell your health care provider about: ? Any history of mental health conditions, including feelings of depression, sadness, or anxiety. ? Any medicines that you take for a mental health condition. These include herbs and supplements.  Home instructions to prepare for pregnancy Lifestyle  Eat a balanced diet. This includes fresh fruits and vegetables, whole grains, lean meats, low-fat dairy products, healthy fats, and foods that are high in fiber. Ask to meet with a nutritionist or registered dietitian for assistance with meal planning and goals.  Get regular exercise. Try to be active for at least 30 minutes a day on most days of the week. Ask your health care provider which activities are safe during pregnancy.  Do not use any products that contain nicotine or tobacco, such  as cigarettes and e-cigarettes. If you need help quitting, ask your health care provider.  Do not drink alcohol.  Do not take illegal drugs.  Maintain a healthy weight. Ask your health care provider what weight range is right for you.  General instructions  Keep an accurate record of your menstrual periods. This makes it easier for your health care provider to determine your baby's due date.  Begin taking prenatal vitamins and folic acid supplements daily as directed by your health care provider.  Manage any chronic conditions, such as high blood pressure and  diabetes, as told by your health care provider. This is important.  How do I know that I am pregnant? You may be pregnant if you have been sexually active and you miss your period. Symptoms of early pregnancy include:  Mild cramping.  Very light vaginal bleeding (spotting).  Feeling unusually tired.  Nausea and vomiting (morning sickness).  If you have any of these symptoms and you suspect that you might be pregnant, you can take a home pregnancy test. These tests check for a hormone in your urine (human chorionic gonadotropin, or hCG). A woman's body begins to make this hormone during early pregnancy. These tests are very accurate. Wait until at least the first day after you miss your period to take one. If the test shows that you are pregnant (you get a positive result), call your health care provider to make an appointment for prenatal care. What should I do if I become pregnant?  Make an appointment with your health care provider as soon as you suspect you are pregnant.  Do not use any products that contain nicotine, such as cigarettes, chewing tobacco, and e-cigarettes. If you need help quitting, ask your health care provider.  Do not drink alcoholic beverages. Alcohol is related to a number of birth defects.  Avoid toxic odors and chemicals.  You may continue to have sexual intercourse if it does not cause pain or other problems, such as vaginal bleeding. This information is not intended to replace advice given to you by your health care provider. Make sure you discuss any questions you have with your health care provider. Document Released: 05/09/2008 Document Revised: 01/23/2016 Document Reviewed: 12/17/2015 Elsevier Interactive Patient Education  2018 [employer].  Breast Self-Awareness Breast self-awareness means being familiar with how your breasts look and feel. It involves checking your breasts regularly and reporting any changes to your health care provider. Practicing  breast self-awareness is important. A change in your breasts can be a sign of a serious medical problem. Being familiar with how your breasts look and feel allows you to find any problems early, when treatment is more likely to be successful. All women should practice breast self-awareness, including women who have had breast implants. How to do a breast self-exam One way to learn what is normal for your breasts and whether your breasts are changing is to do a breast self-exam. To do a breast self-exam: Look for Changes  1. Remove all the clothing above your waist. 2. Stand in front of a mirror in a room with good lighting. 3. Put your hands on your hips. 4. Push your hands firmly downward. 5. Compare your breasts in the mirror. Look for differences between them (asymmetry), such as: ? Differences in shape. ? Differences in size. ? Puckers, dips, and bumps in one breast and not the other. 6. Look at each breast for changes in your skin, such as: ? Redness. ? Scaly areas. 7.  Look for changes in your nipples, such as: ? Discharge. ? Bleeding. ? Dimpling. ? Redness. ? A change in position. Feel for Changes  Carefully feel your breasts for lumps and changes. It is best to do this while lying on your back on the floor and again while sitting or standing in the shower or tub with soapy water on your skin. Feel each breast in the following way:  Place the arm on the side of the breast you are examining above your head.  Feel your breast with the other hand.  Start in the nipple area and make  inch (2 cm) overlapping circles to feel your breast. Use the pads of your three middle fingers to do this. Apply light pressure, then medium pressure, then firm pressure. The light pressure will allow you to feel the tissue closest to the skin. The medium pressure will allow you to feel the tissue that is a little deeper. The firm pressure will allow you to feel the tissue close to the ribs.  Continue  the overlapping circles, moving downward over the breast until you feel your ribs below your breast.  Move one finger-width toward the center of the body. Continue to use the  inch (2 cm) overlapping circles to feel your breast as you move slowly up toward your collarbone.  Continue the up and down exam using all three pressures until you reach your armpit.  Write Down What You Find  Write down what is normal for each breast and any changes that you find. Keep a written record with breast changes or normal findings for each breast. By writing this information down, you do not need to depend only on memory for size, tenderness, or location. Write down where you are in your menstrual cycle, if you are still menstruating. If you are having trouble noticing differences in your breasts, do not get discouraged. With time you will become more familiar with the variations in your breasts and more comfortable with the exam. How often should I examine my breasts? Examine your breasts every month. If you are breastfeeding, the best time to examine your breasts is after a feeding or after using a breast pump. If you menstruate, the best time to examine your breasts is 5-7 days after your period is over. During your period, your breasts are lumpier, and it may be more difficult to notice changes. When should I see my health care provider? See your health care provider if you notice:  A change in shape or size of your breasts or nipples.  A change in the skin of your breast or nipples, such as a reddened or scaly area.  Unusual discharge from your nipples.  A lump or thick area that was not there before.  Pain in your breasts.  Anything that concerns you.  This information is not intended to replace advice given to you by your health care provider. Make sure you discuss any questions you have with your health care provider. Document Released: 05/27/2005 Document Revised: 11/02/2015 Document Reviewed:  04/16/2015 Elsevier Interactive Patient Education  2018 [employer].  EXERCISE AND DIET:  We recommended that you start or continue a regular exercise program for good health. Regular exercise means any activity that makes your heart beat faster and makes you sweat.  We recommend exercising at least 30 minutes per day at least 3 days a week, preferably 4 or 5.  We also recommend a diet low in fat and sugar.  Inactivity, poor dietary  choices and obesity can cause diabetes, heart attack, stroke, and kidney damage, among others.    ALCOHOL AND SMOKING:  Women should limit their alcohol intake to no more than 7 drinks/beers/glasses of wine (combined, not each!) per week. Moderation of alcohol intake to this level decreases your risk of breast cancer and liver damage. And of course, no recreational drugs are part of a healthy lifestyle.  And absolutely no smoking or even second hand smoke. Most people know smoking can cause heart and lung diseases, but did you know it also contributes to weakening of your bones? Aging of your skin?  Yellowing of your teeth and nails?  CALCIUM AND VITAMIN D:  Adequate intake of calcium and Vitamin D are recommended.  The recommendations for exact amounts of these supplements seem to change often, but generally speaking 600 mg of calcium (either carbonate or citrate) and 800 units of Vitamin D per day seems prudent. Certain women may benefit from higher intake of Vitamin D.  If you are among these women, your doctor will have told you during your visit.    PAP SMEARS:  Pap smears, to check for cervical cancer or precancers,  have traditionally been done yearly, although recent scientific advances have shown that most women can have pap smears less often.  However, every woman still should have a physical exam from her gynecologist every year. It will include a breast check, inspection of the vulva and vagina to check for abnormal growths or skin changes, a visual exam of the  cervix, and then an exam to evaluate the size and shape of the uterus and ovaries.  And after 30 years of age, a rectal exam is indicated to check for rectal cancers. We will also provide age appropriate advice regarding health maintenance, like when you should have certain vaccines, screening for sexually transmitted diseases, bone density testing, colonoscopy, mammograms, etc.    What You Need to Know About Marijuana Use Marijuana is a mixture of the dried leaves and flowers of the hemp plant Cannabis sativa. The plant's active ingredients (cannabinoids) change the chemistry of the brain. If you smoke or eat marijuana, you will experience changes in the way you think, feel, and behave. Many people use marijuana because it helps them relax and puts them in a pleasurable mood (marijuana high). Some people use marijuana for medical effects, such as:  Reduced nausea.  Increased appetite.  Reduced muscle spasm.  Pain relief.  Researchers are studying other possible medical uses for marijuana. How can marijuana use affect me? Many people find a marijuana high to be pleasurable and relaxing. Other people find a marijuana high to be uncomfortable or anxiety-causing. This drug can cause short-term and long-term physical and mental effects. Taking high doses of marijuana or trying to quit marijuana can also affect you. Short-term effects of marijuana use include:  Temporary relief of symptoms from a medical condition.  Changes in mood and perception (feeling high).  Increased hunger.  Increased heart rate.  Slowed movement and reaction time.  Poor memory, judgment, and problem solving ability.  Altered sense of time.  Changes to vision.  Bloodshot eyes.  Coughing.  Long-term effects of marijuana use include:  Higher risk of lung and breathing problems.  Higher risk of heart attack.  Higher risk of testicular cancer.  Mental and physical dependence (addiction).  Slowed brain  development in young people. Babies whose mothers used marijuana during pregnancy have an increased risk of problems with brain development and behavior.  Temporary periods of false perceptions or beliefs (hallucinations or paranoia).  Worsening of mental illness.  Onset of new mental illness such as anxiety, depression, or suicidal thoughts.  Withdrawal symptoms when stopping marijuana, such as sleeplessness, anxiety, cravings, and anger.  Difficulty maintaining healthy relationships.  Poor memory, and difficulty concentrating and learning. This can result in decreased intelligence and poor performance at school or work, and an increased risk of dropping out of school.  Higher risk of using other substances like alcohol and nicotine.  High doses of marijuana can cause:  Panic.  Anxiety.  Mental confusion.  Hallucinations.  Quitting marijuana after using it for a long time can cause withdrawal symptoms, such as:  Headache.  Shakiness.  Sweating.  Stomach pain.  Nausea.  Restlessness.  Irritability.  Trouble sleeping.  Decreased appetite.  What are the benefits of not using marijuana? Not using marijuana can keep you from becoming dependent on it. You can avoid the negative effects of the drug that can reduce your quality of life. You can avoid accidents caused by the slowed reaction time that is common with marijuana use. If I already use marijuana, what steps can I take to stop using it? If you are not physically or mentally dependent on marijuana, you should be able to stop using it on your own. If you cannot stop on your own, ask your health care provider for help. Treatment for marijuana addiction is similar to treatment for other addictions. It may include:  Cognitive-behavioral therapy (psychotherapy). This may include individual or group therapy.  Joining a support group.  Treating medical, behavioral, or mental health conditions that exist along with  marijuana dependency.  Where can I get more information? Learn more about:  Marijuana from the [place] [employer] on Drug Abuse: Natworking.hu  Medical marijuana from the [employer] of Health: RunningShows.co.za  Treatment options from the Substance Abuse and Mental Health Services Administration: https://findtreatment.http://gonzalez-rivas.net/  Recovery from marijuana dependency from Recovery.org: http://www.recovery.org/topics/marijuana-recovery  When should I seek medical care? Talk with your health care provider if:  You want to stop using marijuana but you cannot.  You have withdrawal symptoms when you try to stop using marijuana.  You are using marijuana every day.  You are using marijuana along with other drugs like cocaine or alcohol.  You have anxiety or depression.  You have hallucinations or paranoia.  Marijuana use is interfering with your relationships or your ability to function normally at school or at work.  Summary  You may become physically or mentally dependent on marijuana.  Long-term use may interfere with your ability to function normally at home, school, or work.  Marijuana addiction is treatable. This information is not intended to replace advice given to you by your health care provider. Make sure you discuss any questions you have with your health care provider. Document Released: 05/19/2015 Document Revised: 02/14/2016 Document Reviewed: 02/14/2016 Elsevier Interactive Patient Education  2018 [employer].

## 2017-07-21 LAB — CYTOLOGY - PAP
Diagnosis: NEGATIVE
HPV (WINDOPATH): NOT DETECTED

## 2017-07-22 ENCOUNTER — Telehealth: Payer: Self-pay | Admitting: Obstetrics and Gynecology

## 2017-07-22 ENCOUNTER — Encounter: Payer: Self-pay | Admitting: Obstetrics and Gynecology

## 2017-07-22 NOTE — Telephone Encounter (Signed)
See MyChart message to patient.   Routing to Dr. Oscar LaJertson, will close encounter.

## 2017-07-22 NOTE — Telephone Encounter (Signed)
-----   Message from Mychart, Generic sent at 07/22/2017 4:23 PM EST -----    Hi, Dr. Kristie CowmanJertsen had asked that I send a copy of bloodwork labs that I had done recently. What would be the best way to send a copy?     Thanks,   Brandi Davies

## 2017-08-03 ENCOUNTER — Other Ambulatory Visit: Payer: Self-pay | Admitting: Obstetrics and Gynecology

## 2018-01-01 LAB — OB RESULTS CONSOLE HEPATITIS B SURFACE ANTIGEN: Hepatitis B Surface Ag: NEGATIVE

## 2018-01-01 LAB — OB RESULTS CONSOLE RPR: RPR: NONREACTIVE

## 2018-01-01 LAB — OB RESULTS CONSOLE HIV ANTIBODY (ROUTINE TESTING): HIV: NONREACTIVE

## 2018-01-01 LAB — OB RESULTS CONSOLE RUBELLA ANTIBODY, IGM: Rubella: IMMUNE

## 2018-04-21 ENCOUNTER — Ambulatory Visit (INDEPENDENT_AMBULATORY_CARE_PROVIDER_SITE_OTHER): Payer: Self-pay | Admitting: Pediatrics

## 2018-04-21 DIAGNOSIS — Z7681 Expectant parent(s) prebirth pediatrician visit: Secondary | ICD-10-CM | POA: Insufficient documentation

## 2018-04-21 NOTE — Progress Notes (Signed)
Prenatal counseling for impending newborn done  

## 2018-04-28 LAB — OB RESULTS CONSOLE GBS: GBS: POSITIVE

## 2018-05-26 ENCOUNTER — Inpatient Hospital Stay (HOSPITAL_COMMUNITY): Payer: 59 | Admitting: Anesthesiology

## 2018-05-26 ENCOUNTER — Inpatient Hospital Stay (HOSPITAL_COMMUNITY)
Admission: AD | Admit: 2018-05-26 | Discharge: 2018-05-29 | DRG: 788 | Disposition: A | Discharge status: Home / Self Care | Payer: 59 | Attending: Obstetrics & Gynecology | Admitting: Obstetrics & Gynecology

## 2018-05-26 ENCOUNTER — Encounter (HOSPITAL_COMMUNITY): Payer: Self-pay | Admitting: *Deleted

## 2018-05-26 ENCOUNTER — Other Ambulatory Visit: Payer: Self-pay

## 2018-05-26 ENCOUNTER — Encounter (HOSPITAL_COMMUNITY)
Admission: AD | Disposition: A | Discharge status: Home / Self Care | Payer: Self-pay | Source: Home / Self Care | Attending: Obstetrics & Gynecology

## 2018-05-26 DIAGNOSIS — O134 Gestational [pregnancy-induced] hypertension without significant proteinuria, complicating childbirth: Principal | ICD-10-CM | POA: Diagnosis present

## 2018-05-26 DIAGNOSIS — O99824 Streptococcus B carrier state complicating childbirth: Secondary | ICD-10-CM | POA: Diagnosis present

## 2018-05-26 DIAGNOSIS — F419 Anxiety disorder, unspecified: Secondary | ICD-10-CM | POA: Diagnosis present

## 2018-05-26 DIAGNOSIS — Z3490 Encounter for supervision of normal pregnancy, unspecified, unspecified trimester: Secondary | ICD-10-CM

## 2018-05-26 DIAGNOSIS — Z3A40 40 weeks gestation of pregnancy: Secondary | ICD-10-CM

## 2018-05-26 DIAGNOSIS — O99892 Other specified diseases and conditions complicating childbirth: Secondary | ICD-10-CM | POA: Diagnosis present

## 2018-05-26 DIAGNOSIS — O3663X0 Maternal care for excessive fetal growth, third trimester, not applicable or unspecified: Secondary | ICD-10-CM | POA: Diagnosis present

## 2018-05-26 DIAGNOSIS — J45909 Unspecified asthma, uncomplicated: Secondary | ICD-10-CM | POA: Diagnosis present

## 2018-05-26 DIAGNOSIS — O99344 Other mental disorders complicating childbirth: Secondary | ICD-10-CM | POA: Diagnosis present

## 2018-05-26 HISTORY — DX: Unspecified asthma, uncomplicated: J45.909

## 2018-05-26 HISTORY — DX: Anxiety disorder, unspecified: F41.9

## 2018-05-26 LAB — COMPREHENSIVE METABOLIC PANEL
ALBUMIN: 3 g/dL — AB (ref 3.5–5.0)
ALT: 19 U/L (ref 0–44)
AST: 20 U/L (ref 15–41)
Alkaline Phosphatase: 100 U/L (ref 38–126)
Anion gap: 7 (ref 5–15)
BUN: 12 mg/dL (ref 6–20)
CO2: 20 mmol/L — AB (ref 22–32)
Calcium: 8.4 mg/dL — ABNORMAL LOW (ref 8.9–10.3)
Chloride: 108 mmol/L (ref 98–111)
Creatinine, Ser: 0.54 mg/dL (ref 0.44–1.00)
GFR calc Af Amer: >60 mL/min (ref >60)
GFR calc non Af Amer: >60 mL/min (ref >60)
GLUCOSE: 86 mg/dL (ref 70–99)
Potassium: 4.2 mmol/L (ref 3.5–5.1)
Sodium: 135 mmol/L (ref 135–145)
Total Bilirubin: 0.4 mg/dL (ref 0.3–1.2)
Total Protein: 6 g/dL — ABNORMAL LOW (ref 6.5–8.1)

## 2018-05-26 LAB — CBC
HCT: 36.1 % (ref 36.0–46.0)
Hemoglobin: 12.3 g/dL (ref 12.0–15.0)
MCH: 30.8 pg (ref 26.0–34.0)
MCHC: 34.1 g/dL (ref 30.0–36.0)
MCV: 90.5 fL (ref 80.0–100.0)
Platelets: 150 10*3/uL (ref 150–400)
RBC: 3.99 MIL/uL (ref 3.87–5.11)
RDW: 13.4 % (ref 11.5–15.5)
WBC: 11.3 10*3/uL — ABNORMAL HIGH (ref 4.0–10.5)

## 2018-05-26 LAB — RPR: RPR Ser Ql: NONREACTIVE

## 2018-05-26 LAB — TYPE AND SCREEN
ABO/RH(D): AB POS
Antibody Screen: NEGATIVE

## 2018-05-26 LAB — ABO/RH: ABO/RH(D): AB POS

## 2018-05-26 LAB — URIC ACID: Uric Acid, Serum: 5.7 mg/dL (ref 2.5–7.1)

## 2018-05-26 SURGERY — Surgical Case
Anesthesia: Epidural | Site: Abdomen | Wound class: Clean Contaminated

## 2018-05-26 MED ORDER — PENICILLIN G 3 MILLION UNITS IVPB - SIMPLE MED: 3.0000 10*6.[IU] | INTRAVENOUS | Status: DC

## 2018-05-26 MED ORDER — OXYCODONE HCL 5 MG PO TABS: 5.0000 mg | ORAL_TABLET | Freq: Once | ORAL | Status: DC | PRN

## 2018-05-26 MED ORDER — LACTATED RINGERS IV SOLN
INTRAVENOUS | Status: DC
  Administered 2018-05-27: via INTRAVENOUS

## 2018-05-26 MED ORDER — LIDOCAINE HCL (PF) 1 % IJ SOLN
30.0000 mL | INTRAMUSCULAR | Status: DC | PRN
  Filled 2018-05-26: qty 30

## 2018-05-26 MED ORDER — FENTANYL 2.5 MCG/ML BUPIVACAINE 1/10 % EPIDURAL INFUSION (WH - ANES)
14.0000 mL/h | INTRAMUSCULAR | Status: DC | PRN
  Administered 2018-05-26 (×2): 14 mL/h via EPIDURAL
  Filled 2018-05-26 (×2): qty 100

## 2018-05-26 MED ORDER — OXYTOCIN 10 UNIT/ML IJ SOLN
INTRAVENOUS | Status: DC | PRN
  Administered 2018-05-26: 40 [IU] via INTRAVENOUS

## 2018-05-26 MED ORDER — EPHEDRINE 5 MG/ML INJ: 10.0000 mg | INTRAVENOUS | Status: DC | PRN

## 2018-05-26 MED ORDER — SODIUM CHLORIDE 0.9 % IV SOLN
5.0000 10*6.[IU] | Freq: Once | INTRAVENOUS | Status: AC
  Administered 2018-05-26: 5 10*6.[IU] via INTRAVENOUS
  Filled 2018-05-26: qty 5

## 2018-05-26 MED ORDER — PHENYLEPHRINE 40 MCG/ML (10ML) SYRINGE FOR IV PUSH (FOR BLOOD PRESSURE SUPPORT)
80.0000 ug | PREFILLED_SYRINGE | INTRAVENOUS | Status: DC | PRN
  Filled 2018-05-26: qty 10

## 2018-05-26 MED ORDER — FLEET ENEMA 7-19 GM/118ML RE ENEM: 1.0000 | ENEMA | Freq: Once | RECTAL | Status: DC

## 2018-05-26 MED ORDER — PHENYLEPHRINE 40 MCG/ML (10ML) SYRINGE FOR IV PUSH (FOR BLOOD PRESSURE SUPPORT): 80.0000 ug | PREFILLED_SYRINGE | INTRAVENOUS | Status: DC | PRN

## 2018-05-26 MED ORDER — OXYTOCIN 40 UNITS IN LACTATED RINGERS INFUSION - SIMPLE MED
1.0000 m[IU]/min | INTRAVENOUS | Status: DC
  Administered 2018-05-26: 2 m[IU]/min via INTRAVENOUS

## 2018-05-26 MED ORDER — DIBUCAINE 1 % RE OINT: 1.0000 "application " | TOPICAL_OINTMENT | RECTAL | Status: DC | PRN

## 2018-05-26 MED ORDER — OXYTOCIN BOLUS FROM INFUSION: 500.0000 mL | Freq: Once | INTRAVENOUS | Status: DC

## 2018-05-26 MED ORDER — SODIUM BICARBONATE 8.4 % IV SOLN
INTRAVENOUS | Status: DC | PRN
  Administered 2018-05-26: 2 mL via EPIDURAL
  Administered 2018-05-26: 5 mL via EPIDURAL
  Administered 2018-05-26 (×2): 2 mL via EPIDURAL
  Administered 2018-05-26: 8 mL via EPIDURAL

## 2018-05-26 MED ORDER — ACETAMINOPHEN 325 MG PO TABS: 650.0000 mg | ORAL_TABLET | ORAL | Status: DC | PRN

## 2018-05-26 MED ORDER — STERILE WATER FOR IRRIGATION IR SOLN
Status: DC | PRN
  Administered 2018-05-26: 1000 mL

## 2018-05-26 MED ORDER — SIMETHICONE 80 MG PO CHEW
80.0000 mg | CHEWABLE_TABLET | ORAL | Status: DC
  Administered 2018-05-27 – 2018-05-28 (×3): 80 mg via ORAL
  Filled 2018-05-26 (×3): qty 1

## 2018-05-26 MED ORDER — SIMETHICONE 80 MG PO CHEW: 80.0000 mg | CHEWABLE_TABLET | ORAL | Status: DC | PRN

## 2018-05-26 MED ORDER — CEFAZOLIN SODIUM-DEXTROSE 2-3 GM-%(50ML) IV SOLR
INTRAVENOUS | Status: DC | PRN
  Administered 2018-05-26: 2 g via INTRAVENOUS

## 2018-05-26 MED ORDER — SOD CITRATE-CITRIC ACID 500-334 MG/5ML PO SOLN
30.0000 mL | ORAL | Status: DC | PRN
  Administered 2018-05-26 (×2): 30 mL via ORAL
  Filled 2018-05-26 (×2): qty 15

## 2018-05-26 MED ORDER — CITALOPRAM HYDROBROMIDE 20 MG PO TABS
20.0000 mg | ORAL_TABLET | Freq: Every day | ORAL | Status: DC
  Administered 2018-05-27 – 2018-05-29 (×3): 20 mg via ORAL
  Filled 2018-05-26 (×5): qty 1

## 2018-05-26 MED ORDER — MORPHINE SULFATE (PF) 0.5 MG/ML IJ SOLN
INTRAMUSCULAR | Status: DC | PRN
  Administered 2018-05-26: 3 mg via EPIDURAL

## 2018-05-26 MED ORDER — ONDANSETRON HCL 4 MG/2ML IJ SOLN
4.0000 mg | Freq: Four times a day (QID) | INTRAMUSCULAR | Status: DC | PRN
  Administered 2018-05-26 (×2): 4 mg via INTRAVENOUS
  Filled 2018-05-26: qty 2

## 2018-05-26 MED ORDER — OXYCODONE-ACETAMINOPHEN 5-325 MG PO TABS: 1.0000 | ORAL_TABLET | ORAL | Status: DC | PRN

## 2018-05-26 MED ORDER — LACTATED RINGERS IV SOLN
500.0000 mL | Freq: Once | INTRAVENOUS | Status: DC
  Administered 2018-05-26: 500 mL via INTRAVENOUS

## 2018-05-26 MED ORDER — DEXAMETHASONE SODIUM PHOSPHATE 4 MG/ML IJ SOLN
INTRAMUSCULAR | Status: DC | PRN
  Administered 2018-05-26: 4 mg via INTRAVENOUS

## 2018-05-26 MED ORDER — TETANUS-DIPHTH-ACELL PERTUSSIS 5-2.5-18.5 LF-MCG/0.5 IM SUSP: 0.5000 mL | Freq: Once | INTRAMUSCULAR | Status: DC

## 2018-05-26 MED ORDER — MORPHINE SULFATE (PF) 0.5 MG/ML IJ SOLN
INTRAMUSCULAR | Status: AC
  Filled 2018-05-26: qty 10

## 2018-05-26 MED ORDER — OXYTOCIN 40 UNITS IN LACTATED RINGERS INFUSION - SIMPLE MED: 2.5000 [IU]/h | INTRAVENOUS | Status: DC

## 2018-05-26 MED ORDER — ONDANSETRON HCL 4 MG/2ML IJ SOLN
INTRAMUSCULAR | Status: AC
  Filled 2018-05-26: qty 2

## 2018-05-26 MED ORDER — PENICILLIN G 3 MILLION UNITS IVPB - SIMPLE MED
3.0000 10*6.[IU] | INTRAVENOUS | Status: DC
  Administered 2018-05-26 (×2): 3 10*6.[IU] via INTRAVENOUS
  Filled 2018-05-26 (×5): qty 100

## 2018-05-26 MED ORDER — BUTORPHANOL TARTRATE 1 MG/ML IJ SOLN
INTRAMUSCULAR | Status: AC
  Administered 2018-05-26: 1 mg
  Filled 2018-05-26: qty 1

## 2018-05-26 MED ORDER — IBUPROFEN 600 MG PO TABS
600.0000 mg | ORAL_TABLET | Freq: Four times a day (QID) | ORAL | Status: DC | PRN
  Administered 2018-05-26 – 2018-05-29 (×11): 600 mg via ORAL
  Filled 2018-05-26 (×11): qty 1

## 2018-05-26 MED ORDER — BUTORPHANOL TARTRATE 1 MG/ML IJ SOLN: 1.0000 mg | Freq: Once | INTRAMUSCULAR | Status: DC

## 2018-05-26 MED ORDER — OXYTOCIN 40 UNITS IN LACTATED RINGERS INFUSION - SIMPLE MED
2.5000 [IU]/h | INTRAVENOUS | Status: DC
  Filled 2018-05-26: qty 1000

## 2018-05-26 MED ORDER — OXYCODONE-ACETAMINOPHEN 5-325 MG PO TABS: 2.0000 | ORAL_TABLET | ORAL | Status: DC | PRN

## 2018-05-26 MED ORDER — ACETAMINOPHEN 10 MG/ML IV SOLN: 1000.0000 mg | Freq: Once | INTRAVENOUS | Status: DC | PRN

## 2018-05-26 MED ORDER — SIMETHICONE 80 MG PO CHEW
80.0000 mg | CHEWABLE_TABLET | Freq: Three times a day (TID) | ORAL | Status: DC
  Administered 2018-05-26 – 2018-05-29 (×9): 80 mg via ORAL
  Filled 2018-05-26 (×9): qty 1

## 2018-05-26 MED ORDER — HYDROMORPHONE HCL 1 MG/ML IJ SOLN: 0.2500 mg | INTRAMUSCULAR | Status: DC | PRN

## 2018-05-26 MED ORDER — DEXAMETHASONE SODIUM PHOSPHATE 4 MG/ML IJ SOLN
INTRAMUSCULAR | Status: AC
  Filled 2018-05-26: qty 1

## 2018-05-26 MED ORDER — TERBUTALINE SULFATE 1 MG/ML IJ SOLN: 0.2500 mg | Freq: Once | INTRAMUSCULAR | Status: DC | PRN

## 2018-05-26 MED ORDER — ACETAMINOPHEN 325 MG PO TABS
650.0000 mg | ORAL_TABLET | ORAL | Status: DC | PRN
  Administered 2018-05-28 (×2): 650 mg via ORAL
  Filled 2018-05-26 (×2): qty 2

## 2018-05-26 MED ORDER — LACTATED RINGERS IV SOLN
INTRAVENOUS | Status: DC | PRN
  Administered 2018-05-26: 14:00:00 via INTRAVENOUS

## 2018-05-26 MED ORDER — MENTHOL 3 MG MT LOZG: 1.0000 | LOZENGE | OROMUCOSAL | Status: DC | PRN

## 2018-05-26 MED ORDER — ALBUTEROL SULFATE (2.5 MG/3ML) 0.083% IN NEBU: 3.0000 mL | INHALATION_SOLUTION | Freq: Four times a day (QID) | RESPIRATORY_TRACT | Status: DC | PRN

## 2018-05-26 MED ORDER — PROMETHAZINE HCL 25 MG/ML IJ SOLN: 6.2500 mg | INTRAMUSCULAR | Status: DC | PRN

## 2018-05-26 MED ORDER — WITCH HAZEL-GLYCERIN EX PADS: 1.0000 "application " | MEDICATED_PAD | CUTANEOUS | Status: DC | PRN

## 2018-05-26 MED ORDER — MISOPROSTOL 25 MCG QUARTER TABLET
25.0000 ug | ORAL_TABLET | ORAL | Status: DC | PRN
  Filled 2018-05-26: qty 1

## 2018-05-26 MED ORDER — COCONUT OIL OIL
1.0000 "application " | TOPICAL_OIL | Status: DC | PRN
  Administered 2018-05-28: 1 via TOPICAL
  Filled 2018-05-26: qty 120

## 2018-05-26 MED ORDER — SENNOSIDES-DOCUSATE SODIUM 8.6-50 MG PO TABS
2.0000 | ORAL_TABLET | ORAL | Status: DC
  Administered 2018-05-27 – 2018-05-28 (×3): 2 via ORAL
  Filled 2018-05-26 (×3): qty 2

## 2018-05-26 MED ORDER — PRENATAL MULTIVITAMIN CH
1.0000 | ORAL_TABLET | Freq: Every day | ORAL | Status: DC
  Administered 2018-05-27 – 2018-05-29 (×3): 1 via ORAL
  Filled 2018-05-26 (×3): qty 1

## 2018-05-26 MED ORDER — DIPHENHYDRAMINE HCL 25 MG PO CAPS: 25.0000 mg | ORAL_CAPSULE | Freq: Four times a day (QID) | ORAL | Status: DC | PRN

## 2018-05-26 MED ORDER — SODIUM CHLORIDE 0.9 % IR SOLN
Status: DC | PRN
  Administered 2018-05-26: 1000 mL

## 2018-05-26 MED ORDER — DIPHENHYDRAMINE HCL 50 MG/ML IJ SOLN: 12.5000 mg | INTRAMUSCULAR | Status: DC | PRN

## 2018-05-26 MED ORDER — LACTATED RINGERS IV SOLN: 500.0000 mL | Freq: Once | INTRAVENOUS | Status: DC

## 2018-05-26 MED ORDER — LIDOCAINE HCL (PF) 1 % IJ SOLN
INTRAMUSCULAR | Status: DC | PRN
  Administered 2018-05-26: 4 mL via EPIDURAL

## 2018-05-26 MED ORDER — ACETAMINOPHEN 10 MG/ML IV SOLN
INTRAVENOUS | Status: AC
  Administered 2018-05-26: 1000 mg
  Filled 2018-05-26: qty 100

## 2018-05-26 MED ORDER — LACTATED RINGERS IV SOLN: 500.0000 mL | INTRAVENOUS | Status: DC | PRN

## 2018-05-26 MED ORDER — CEFAZOLIN SODIUM-DEXTROSE 2-4 GM/100ML-% IV SOLN: 2.0000 g | INTRAVENOUS | Status: DC

## 2018-05-26 MED ORDER — OXYCODONE HCL 5 MG/5ML PO SOLN: 5.0000 mg | Freq: Once | ORAL | Status: DC | PRN

## 2018-05-26 MED ORDER — LACTATED RINGERS IV SOLN
INTRAVENOUS | Status: DC
  Administered 2018-05-26 (×4): via INTRAVENOUS

## 2018-05-26 SURGICAL SUPPLY — 30 items
BENZOIN TINCTURE PRP APPL 2/3 (GAUZE/BANDAGES/DRESSINGS) ×2 IMPLANT
CHLORAPREP W/TINT 26ML (MISCELLANEOUS) ×2 IMPLANT
CLAMP CORD UMBIL (MISCELLANEOUS) ×2 IMPLANT
CLOSURE STERI STRIP 1/2 X4 (GAUZE/BANDAGES/DRESSINGS) ×2 IMPLANT
CLOTH BEACON ORANGE TIMEOUT ST (SAFETY) ×2 IMPLANT
DRSG OPSITE POSTOP 4X10 (GAUZE/BANDAGES/DRESSINGS) ×2 IMPLANT
ELECT REM PT RETURN 9FT ADLT (ELECTROSURGICAL) ×2
ELECTRODE REM PT RTRN 9FT ADLT (ELECTROSURGICAL) ×1 IMPLANT
GLOVE BIO SURGEON STRL SZ7 (GLOVE) ×2 IMPLANT
GLOVE BIOGEL PI IND STRL 7.0 (GLOVE) ×2 IMPLANT
GLOVE BIOGEL PI INDICATOR 7.0 (GLOVE) ×2
GOWN STRL REUS W/TWL LRG LVL3 (GOWN DISPOSABLE) ×4 IMPLANT
KIT ABG SYR 3ML LUER SLIP (SYRINGE) ×2 IMPLANT
NEEDLE HYPO 25X5/8 SAFETYGLIDE (NEEDLE) ×2 IMPLANT
NS IRRIG 1000ML POUR BTL (IV SOLUTION) ×2 IMPLANT
PACK C SECTION WH (CUSTOM PROCEDURE TRAY) ×2 IMPLANT
PAD OB MATERNITY 4.3X12.25 (PERSONAL CARE ITEMS) ×2 IMPLANT
RTRCTR C-SECT PINK 25CM LRG (MISCELLANEOUS) ×2 IMPLANT
SPONGE LAP 18X18 RF (DISPOSABLE) ×6 IMPLANT
STRIP CLOSURE SKIN 1/2X4 (GAUZE/BANDAGES/DRESSINGS) ×2 IMPLANT
SUT MNCRL 0 VIOLET CTX 36 (SUTURE) ×2 IMPLANT
SUT MONOCRYL 0 CTX 36 (SUTURE) ×2
SUT PLAIN 2 0 (SUTURE) ×1
SUT PLAIN ABS 2-0 CT1 27XMFL (SUTURE) ×1 IMPLANT
SUT VIC AB 0 CT1 27 (SUTURE) ×2
SUT VIC AB 0 CT1 27XBRD ANBCTR (SUTURE) ×2 IMPLANT
SUT VIC AB 2-0 CT1 27 (SUTURE) ×1
SUT VIC AB 2-0 CT1 TAPERPNT 27 (SUTURE) ×1 IMPLANT
SUT VIC AB 4-0 KS 27 (SUTURE) ×2 IMPLANT
TOWEL OR 17X24 6PK STRL BLUE (TOWEL DISPOSABLE) ×2 IMPLANT

## 2018-05-26 NOTE — Transfer of Care (Signed)
Immediate Anesthesia Transfer of Care Note  Patient: Brandi Davies  Procedure(s) Performed: CESAREAN SECTION (N/A Abdomen)  Patient Location: PACU  Anesthesia Type:Spinal  Level of Consciousness: awake  Airway & Oxygen Therapy: Patient Spontanous Breathing  Post-op Assessment: Report given to RN and Post -op Vital signs reviewed and stable  Post vital signs: Reviewed and stable  Last Vitals:  Vitals Value Taken Time  BP 149/116 05/26/2018  3:00 PM  Temp    Pulse 78 05/26/2018  3:02 PM  Resp 15 05/26/2018  3:02 PM  SpO2 99 % 05/26/2018  3:02 PM  Vitals shown include unvalidated device data.  Last Pain:  Vitals:   05/26/18 1300  TempSrc: Oral  PainSc:       Patients Stated Pain Goal: 1 (05/26/18 0413)  Complications: No apparent anesthesia complications

## 2018-05-26 NOTE — Lactation Note (Signed)
This note was copied from a baby's chart. Lactation Consultation Note  Patient Name: Brandi Davies'XToday's Date: 05/26/2018 Reason for consult: Initial assessment;Term;Primapara;1st time breastfeeding  P1 mother whose infant is now 84 hours old.  Baby was doing STS with mother when I arrived.  She was sleeping and not showing any feeding cues.  Encouraged mother to feed 8-12 times/24 hours or sooner if baby shows feeding cues.  Reviewed feeding cues.  Mother is familiar with hand expression and is able to express a few drops of colostrum.  She had noticed colostrum as early as 3 weeks ago.  Encouraged to hand express before/after feedings to help increase milk supply.  Colostrum container provided and milk storage times reviewed.  Finger feeding demonstrated.    Mother will be returning to work in 12 weeks and has a DEBP for home use.  Suggested she have her RN/LC observe the next feeding and to call for latch assistance as needed.  Discussed breast feeding basics.  Mother has taken breast feeding classes and participated in the hospital tour.  Father and visitor present.   Maternal Data Formula Feeding for Exclusion: No Has patient been taught Hand Expression?: Yes Does the patient have breastfeeding experience prior to this delivery?: No  Feeding Feeding Type: Breast Fed  LATCH Score Latch: Repeated attempts needed to sustain latch, nipple held in mouth throughout feeding, stimulation needed to elicit sucking reflex.  Audible Swallowing: A few with stimulation  Type of Nipple: Flat  Comfort (Breast/Nipple): Soft / non-tender  Hold (Positioning): Assistance needed to correctly position infant at breast and maintain latch.  LATCH Score: 6  Interventions    Lactation Tools Discussed/Used WIC Program: No   Consult Status Consult Status: Follow-up Date: 05/27/18 Follow-up type: In-patient    Dora SimsBeth R Mollie Rossano 05/26/2018, 6:49 PM

## 2018-05-26 NOTE — Progress Notes (Addendum)
S: woke pt up  O: BP 136/84   Pulse 88   Temp 98.7 F (37.1 C) (Oral)   Resp 18   Ht 5\' 7"  (1.702 m)   Wt 111 kg   LMP 07/11/2017   SpO2 96%   BMI 38.34 kg/m  VE fully bulging membrane (-3) applied now fully -2 asynclitic ROP AROm clear fluid IUPC placed  Tracing: baseline 120 (+) accel to 180 Dysfunctional uterine pattern  IMP: complete with LGA baby( EFW 9 1/2 lb clinically P) right exaggerated sims position.  May need pitocin augmentation if dysfunctional pattern. Advised pt and husband of possible need for C/s. Reassess ctx after 30 mins s/p ROM. Also disc poss shoulder dystocia with vaginal delivery

## 2018-05-26 NOTE — Progress Notes (Addendum)
Patient ID: Brandi Davies, female   DOB: May 12, 1986, 32 y.o.   MRN: [id number] 40 wks, G1, presented in labor and progressed well, complete at 9 am with high station. Since then tried pitocin, turned her in different directions, used peanut, UCs are adequate, but stn at -1 to 0. Hematuria ++.  FHT 120s + accels no decels mod variab, cat I, Toco adequate w IUPC.  Arrest of decent in mid-pelvis.  Recc. C/section, she agrees.   Risks/complications of surgery reviewed incl infection, bleeding, damage to internal organs including bladder, bowels, ureters, blood vessels, other risks from anesthesia, VTE and delayed complications of any surgery, complications in future surgery reviewed. Also discussed neonatal complications incl difficult delivery, laceration, vacuum assistance, TTN etc. Pt understands and agrees, all concerns addressed.    V.Mira Balon, MD

## 2018-05-26 NOTE — Progress Notes (Signed)
Pt arrived for IOL, not on L and D schedule for IOL. Pt is suppose to be here to IOL according to prenatal record. Called Dr. Juliene PinaMody, order received to direct admit pt. L and D charge notified.

## 2018-05-26 NOTE — Anesthesia Pain Management Evaluation Note (Signed)
  CRNA Pain Management Visit Note  Patient: Brandi Davies, 32 y.o., female  "Hello I am a member of the anesthesia team at [facility]. We have an anesthesia team available at all times to provide care throughout the hospital, including epidural management and anesthesia for C-section. I don't know your plan for the delivery whether it a natural birth, water birth, IV sedation, nitrous supplementation, doula or epidural, but we want to meet your pain goals."   1.Was your pain managed to your expectations on prior hospitalizations?   No prior hospitalizations  2.What is your expectation for pain management during this hospitalization?     Epidural  3.How can we help you reach that goal? Epidural in place  Record the patient's initial score and the patient's pain goal.   Pain: 1  Pain Goal: 3 [facility] wants you to be able to say your pain was always managed very well.  Derren Suydam 05/26/2018

## 2018-05-26 NOTE — Anesthesia Preprocedure Evaluation (Signed)
Anesthesia Evaluation  Patient identified by MRN, date of birth, ID band Patient awake    Reviewed: Allergy & Precautions, Patient's Chart, lab work & pertinent test results  Airway Mallampati: II       Dental no notable dental hx.    Pulmonary asthma ,    Pulmonary exam normal        Cardiovascular negative cardio ROS Normal cardiovascular exam     Neuro/Psych Anxiety    GI/Hepatic negative GI ROS, Neg liver ROS,   Endo/Other  negative endocrine ROS  Renal/GU negative Renal ROS     Musculoskeletal   Abdominal   Peds  Hematology negative hematology ROS (+)   Anesthesia Other Findings   Reproductive/Obstetrics                             Anesthesia Physical Anesthesia Plan  ASA: II  Anesthesia Plan: Epidural   Post-op Pain Management:    Induction:   PONV Risk Score and Plan:   Airway Management Planned: Natural Airway  Additional Equipment: None  Intra-op Plan:   Post-operative Plan:   Informed Consent: I have reviewed the patients History and Physical, chart, labs and discussed the procedure including the risks, benefits and alternatives for the proposed anesthesia with the patient or authorized representative who has indicated his/her understanding and acceptance.     Plan Discussed with:   Anesthesia Plan Comments: (Lab Results      Component                Value               Date                      WBC                      PENDING             05/26/2018                HGB                      12.3                05/26/2018                HCT                      36.1                05/26/2018                MCV                      90.5                05/26/2018                PLT                      150                 05/26/2018           )        Anesthesia Quick Evaluation

## 2018-05-26 NOTE — Progress Notes (Signed)
S: pt sleeping  O: Epidural Patient Vitals for the past 24 hrs:  BP Temp Temp src Pulse Resp SpO2 Height Weight  05/26/18 0600 (!) 117/54 - - 70 20 96 % - -  05/26/18 0530 (!) 142/73 - - 91 19 98 % - -  05/26/18 0500 125/69 - - 80 19 98 % - -  05/26/18 0430 127/78 98.7 F (37.1 C) Oral 80 20 99 % - -  05/26/18 0400 (!) 122/46 - - 68 18 96 % - -  05/26/18 0340 - - - - - 97 % - -  05/26/18 0335 - - - - - 98 % - -  05/26/18 0330 128/80 - - 70 - 96 % - -  05/26/18 0325 136/76 - - 69 - 97 % - -  05/26/18 0320 136/81 - - 69 - 97 % - -  05/26/18 0315 (!) 144/83 - - 68 - 96 % - -  05/26/18 0310 133/79 - - 80 - 98 % - -  05/26/18 0305 (!) 136/93 - - 75 20 - - -  05/26/18 0259 (!) 143/89 - - 81 18 - - -  05/26/18 0256 - - - - - 95 % - -  05/26/18 0255 - - - - - 95 % - -  05/26/18 0206 (!) 143/93 - - 67 19 98 % - -  05/26/18 0142 (!) 168/107 99 F (37.2 C) Oral 71 18 - 5\' 7"  (1.702 m) 111 kg  05/26/18 0105 (!) 150/104 - - 74 18 - - -   Tracing: baseline 120 (+) accels 150 (+)ctx q 3- 4mins  VE per RN 6.5/60/-2 CBC Latest Ref Rng & Units 05/26/2018  WBC 4.0 - 10.5 K/uL 11.3(H)  Hemoglobin 12.0 - 15.0 g/dL 16.112.3  Hematocrit 09.636.0 - 46.0 % 36.1  Platelets 150 - 400 K/uL 150   CMP Latest Ref Rng & Units 05/26/2018  Glucose 70 - 99 mg/dL 86  BUN 6 - 20 mg/dL 12  Creatinine 0.450.44 - 4.091.00 mg/dL 8.110.54  Sodium 914135 - 782145 mmol/L 135  Potassium 3.5 - 5.1 mmol/L 4.2  Chloride 98 - 111 mmol/L 108  CO2 22 - 32 mmol/L 20(L)  Calcium 8.9 - 10.3 mg/dL 9.5(A8.4(L)  Total Protein 6.5 - 8.1 g/dL 6.0(L)  Total Bilirubin 0.3 - 1.2 mg/dL 0.4  Alkaline Phos 38 - 126 U/L 100  AST 15 - 41 U/L 20  ALT 0 - 44 U/L 19   IMP: term gestation GBS cx (+) on IV PCN P) cont present mgmt

## 2018-05-26 NOTE — Anesthesia Procedure Notes (Signed)
Epidural Patient location during procedure: OB Start time: 05/26/2018 2:56 AM End time: 05/26/2018 3:02 AM  Staffing Anesthesiologist: Shelton SilvasHollis, Rindy Kollman D, MD Performed: anesthesiologist   Preanesthetic Checklist Completed: patient identified, site marked, surgical consent, pre-op evaluation, timeout performed, IV checked, risks and benefits discussed and monitors and equipment checked  Epidural Patient position: sitting Prep: ChloraPrep Patient monitoring: heart rate, continuous pulse ox and blood pressure Approach: midline Location: L3-L4 Injection technique: LOR saline  Needle:  Needle type: Tuohy  Needle gauge: 17 G Needle length: 9 cm Catheter type: closed end flexible Catheter size: 20 Guage Test dose: negative and 1.5% lidocaine  Assessment Events: blood not aspirated, injection not painful, no injection resistance and no paresthesia  Additional Notes LOR @ 6  Patient identified. Risks/Benefits/Options discussed with patient including but not limited to bleeding, infection, nerve damage, paralysis, failed block, incomplete pain control, headache, blood pressure changes, nausea, vomiting, reactions to medications, itching and postpartum back pain. Confirmed with bedside nurse the patient's most recent platelet count. Confirmed with patient that they are not currently taking any anticoagulation, have any bleeding history or any family history of bleeding disorders. Patient expressed understanding and wished to proceed. All questions were answered. Sterile technique was used throughout the entire procedure. Please see nursing notes for vital signs. Test dose was given through epidural catheter and negative prior to continuing to dose epidural or start infusion. Warning signs of high block given to the patient including shortness of breath, tingling/numbness in hands, complete motor block, or any concerning symptoms with instructions to call for help. Patient was given instructions  on fall risk and not to get out of bed. All questions and concerns addressed with instructions to call with any issues or inadequate analgesia.    Reason for block:procedure for pain

## 2018-05-26 NOTE — H&P (Signed)
Brandi Davies is a 32 y.o. female presenting for scheduled IOL and was found to be 4-5 cm dilated. GBS cx (+) OB History    Gravida  1   Para  0   Term  0   Preterm  0   AB  0   Living  0     SAB  0   TAB  0   Ectopic  0   Multiple  0   Live Births             Past Medical History:  Diagnosis Date  . Abnormal Pap smear of cervix   . Anxiety   . Asthma    Past Surgical History:  Procedure Laterality Date  . MOUTH SURGERY    . WISDOM TOOTH EXTRACTION     Family History: family history includes Heart attack in her paternal grandfather and paternal grandmother; Heart disease in her paternal grandmother; Stomach cancer in her maternal grandfather and maternal grandmother; Stroke in her paternal grandmother. Social History:  reports that she has never smoked. She has never used smokeless tobacco. She reports current alcohol use of about 7.0 standard drinks of alcohol per week. She reports that she does not use drugs.     Maternal Diabetes: No Genetic Screening: Normal Maternal Ultrasounds/Referrals: Normal Fetal Ultrasounds or other Referrals:  None Maternal Substance Abuse:  No Significant Maternal Medications:  Meds include: Other: celexa Significant Maternal Lab Results:  Lab values include: Group B Strep positive Other Comments:  anxiety  Review of Systems  All other systems reviewed and are negative.  Maternal Medical History:  Fetal activity: Perceived fetal activity is normal.    Prenatal complications: no prenatal complications Prenatal Complications - Diabetes: none.    Dilation: 4.5 Effacement (%): 50 Station: -2 Exam by:: Jeanett SchleinBreanna Price, RN Blood pressure (!) 143/93, pulse 67, temperature 99 F (37.2 C), temperature source Oral, resp. rate 19, height 5\' 7"  (1.702 m), weight 111 kg, last menstrual period 07/11/2017, SpO2 98 %. Exam Physical Exam  Constitutional: She is oriented to person, place, and time. She appears well-developed and  well-nourished.  HENT:  Head: Atraumatic.  Eyes: EOM are normal.  Neck: Neck supple.  Cardiovascular: Regular rhythm.  Respiratory: Effort normal.  GI: Soft.  Musculoskeletal:        General: Edema present.  Neurological: She is alert and oriented to person, place, and time.  Skin: Skin is warm and dry.  Psychiatric: She has a normal mood and affect.    Prenatal labs: ABO, Rh:  AB positive Antibody:  neg Rubella:  immune RPR:   NR HBsAg:   neg HIV:   neg GBS:   positive  Assessment/Plan: Labor Term gestation Hx LEEP GBS cx (+) Gestational HTN  P)admit  PIH labs. IV PCN. epidural   Milledge Gerding A Roslyn Else 05/26/2018, 2:51 AM

## 2018-05-26 NOTE — Anesthesia Postprocedure Evaluation (Signed)
Anesthesia Post Note  Patient: Brandi Davies  Procedure(s) Performed: CESAREAN SECTION (N/A Abdomen)     Patient location during evaluation: Mother Baby Anesthesia Type: Epidural Level of consciousness: awake and alert Pain management: pain level controlled Vital Signs Assessment: post-procedure vital signs reviewed and stable Respiratory status: spontaneous breathing, nonlabored ventilation and respiratory function stable Cardiovascular status: stable Postop Assessment: no headache, no backache and epidural receding Anesthetic complications: no    Last Vitals:  Vitals:   05/26/18 1826 05/26/18 1946  BP: 140/82 135/81  Pulse: 70 67  Resp: 20 18  Temp: 37.2 C 37.1 C  SpO2: 97% 99%    Last Pain:  Vitals:   05/26/18 1946  TempSrc: Oral  PainSc:    Pain Goal: Patients Stated Pain Goal: 1 (05/26/18 0413)               Marrion CoyMERRITT,Lacara Dunsworth

## 2018-05-26 NOTE — Op Note (Signed)
Cesarean Section Procedure Note Brandi Davies   05/26/2018  Procedure: Primary Low Transverse Cesarean section  Indications:Arrest of descent, mid pelvic cephalopelvic dystocia, occiput transverse    Pre-operative Diagnosis: arrest of descent in stage 2, 40 wks   Post-operative Diagnosis: Same   Surgeon:  Shea EvansMody, Saleemah Mollenhauer, MD    Assistants: Roxanne GatesMeredith Sigmon,CNM   Anesthesia: epidural   Procedure Details:  The patient was seen in the Labor Room. Mid pelvic arrest of descent noted and she was advised C-section delivery. The risks, benefits, complications, treatment options, and expected outcomes were discussed with the patient. The patient concurred with the proposed plan, giving informed consent. identified as Brandi Davies and the procedure verified as C-Section Delivery. A Time Out was held and the above information confirmed.  2 gm Ancef given.  After induction of anesthesia, the patient was draped and prepped in the usual sterile manner, foley was draining bloody urine.  A pfannenstiel incision was made and carried down through the subcutaneous tissue to the fascia. Fascial incision was made and extended transversely. The fascia was separated from the underlying rectus tissue superiorly and inferiorly. The peritoneum was identified and entered. Peritoneal incision was extended longitudinally. Alexis-O retractor placed. The utero-vesical peritoneal reflection was incised transversely and the bladder flap was bluntly freed from the lower uterine segment. A low transverse uterine incision was made. Head was deep in mid pelvis in ROP position with extended head and caput. Head delivery was difficult but completed after few attempts to flex and rotate, after head delivery, rest was smooth. Baby GILR was born at 14.08 hours and Apgar scores of 8 at one minute and 8 at five minutes. Delayed cord clamping done at 1 minute and baby handed to NICU team in attendance. Cord ph was sent. Cord blood was obtained for  evaluation. The placenta was removed Intact and appeared normal. Right end of hysterotomy noted 2 cm extension towards lower uterus/upper cervix but apex was seen well and hemostasis obtained with Rings. The tubes and ovaries appeared normal. The uterine incision was closed with running locked sutures of 0Monocryl. A second imbricating layer sutured.   Hemostasis was observed. Alexis retractor removed. Peritoneal closure done with 2-0 Vicryl.  The fascia was then reapproximated with running sutures of 0Vicryl. The subcuticular closure was performed using 2-0plain gut. The skin was closed with 4-0Vicryl.  Sterile dressings placed.  Instrument, sponge, and needle counts were correct prior the abdominal closure and were correct at the conclusion of the case.   Findings: Baby girl, delivered cephalic from mid pelvic transverse arrest. Hyperextended head. Small hysterotomy extension within lower segment. 2 layer closure of hysterotomy. LGA baby, weight pending.    Estimated Blood Loss: 763 mL   Total IV Fluids: LR 1700 ml   Urine Output: 55CC OF bloody urine but cleared up at the end of surgery  Specimens: Cord gas, cord blood   Complications: no complications  Disposition: PACU - hemodynamically stable.   Maternal Condition: stable   Baby condition / location:  Couplet care / Skin to Skin  Attending Attestation: I performed the procedure.   Signed: Surgeon(s): Shea EvansMody, Carolanne Mercier, MD

## 2018-05-27 LAB — CBC
HEMATOCRIT: 31.8 % — AB (ref 36.0–46.0)
Hemoglobin: 10.5 g/dL — ABNORMAL LOW (ref 12.0–15.0)
MCH: 31.1 pg (ref 26.0–34.0)
MCHC: 33 g/dL (ref 30.0–36.0)
MCV: 94.1 fL (ref 80.0–100.0)
NRBC: 0 % (ref 0.0–0.2)
Platelets: 148 10*3/uL — ABNORMAL LOW (ref 150–400)
RBC: 3.38 MIL/uL — ABNORMAL LOW (ref 3.87–5.11)
RDW: 14 % (ref 11.5–15.5)
WBC: 16.1 10*3/uL — ABNORMAL HIGH (ref 4.0–10.5)

## 2018-05-27 LAB — BIRTH TISSUE RECOVERY COLLECTION (PLACENTA DONATION)

## 2018-05-27 MED ORDER — SCOPOLAMINE 1 MG/3DAYS TD PT72
1.0000 | MEDICATED_PATCH | Freq: Once | TRANSDERMAL | Status: DC
  Filled 2018-05-27: qty 1

## 2018-05-27 MED ORDER — NALBUPHINE HCL 10 MG/ML IJ SOLN: 5.0000 mg | Freq: Once | INTRAMUSCULAR | Status: DC | PRN

## 2018-05-27 MED ORDER — NALBUPHINE HCL 10 MG/ML IJ SOLN: 5.0000 mg | INTRAMUSCULAR | Status: DC | PRN

## 2018-05-27 MED ORDER — DIPHENHYDRAMINE HCL 50 MG/ML IJ SOLN: 12.5000 mg | INTRAMUSCULAR | Status: DC | PRN

## 2018-05-27 MED ORDER — DIPHENHYDRAMINE HCL 25 MG PO CAPS: 25.0000 mg | ORAL_CAPSULE | ORAL | Status: DC | PRN

## 2018-05-27 MED ORDER — SODIUM CHLORIDE 0.9% FLUSH: 3.0000 mL | INTRAVENOUS | Status: DC | PRN

## 2018-05-27 MED ORDER — NALOXONE HCL 0.4 MG/ML IJ SOLN: 0.4000 mg | INTRAMUSCULAR | Status: DC | PRN

## 2018-05-27 MED ORDER — ONDANSETRON HCL 4 MG/2ML IJ SOLN: 4.0000 mg | Freq: Three times a day (TID) | INTRAMUSCULAR | Status: DC | PRN

## 2018-05-27 MED ORDER — NALOXONE HCL 4 MG/10ML IJ SOLN: 1.0000 ug/kg/h | INTRAVENOUS | Status: DC | PRN

## 2018-05-27 NOTE — Progress Notes (Signed)
MOB was referred for history of depression/anxiety. * Referral screened out by Clinical Social Worker because none of the following criteria appear to apply: ~ History of anxiety/depression during this pregnancy, or of post-partum depression following prior delivery. ~ Diagnosis of anxiety and/or depression within last 3 years OR * MOB's symptoms currently being treated with medication and/or therapy. MOB has an active Rx for Celexa.   Please contact the Clinical Social Worker if needs arise, by MOB request, or if MOB scores greater than 9/yes to question 10 on Edinburgh Postpartum Depression Screen.   Shelbie Franken Boyd-Gilyard, MSW, LCSW Clinical Social Work [phone]  

## 2018-05-27 NOTE — Lactation Note (Signed)
This note was copied from a baby's chart. Lactation Consultation Note  Patient Name: Brandi Davies ZOXWR'UToday's Date: 05/27/2018 Reason for consult: Follow-up assessment;Mother's request  Telephone call from RN reporting infant not breastfeeding well and coming off and on the breast.  Infant STS on arrival and no longer cuing.  Laid mom back slightly and did some hand expression and rolled nipple out slightly and showed mom how to scoop her breast up to get more in infants mouth.  Infant latched and breastfed well.  Still comes off and on occasionally but mom reports this is the best she has latched.  Discussed prepumping prior to latching also.  Got dad assisting mom with latching. Urged mom not to hold infants head, just support it. Left mom, baby and dad assisting with breastfeeding.  Urged parents to hand express and spoon feed back all EBM past breastfeeding as dessert. Urged mom to call lactation as needed.  Maternal Data    Feeding Feeding Type: Breast Fed  LATCH Score Latch: Repeated attempts needed to sustain latch, nipple held in mouth throughout feeding, stimulation needed to elicit sucking reflex.  Audible Swallowing: A few with stimulation  Type of Nipple: Everted at rest and after stimulation  Comfort (Breast/Nipple): Soft / non-tender  Hold (Positioning): Assistance needed to correctly position infant at breast and maintain latch.  LATCH Score: 7  Interventions Interventions: Assisted with latch;Hand express;Pre-pump if needed;Hand pump  Lactation Tools Discussed/Used     Consult Status Consult Status: Follow-up Date: 05/28/18 Follow-up type: In-patient    [name] Michaelle CopasS Tajai Ihde 05/27/2018, 3:58 PM

## 2018-05-27 NOTE — Addendum Note (Signed)
Addendum  created 05/27/18 1002 by Elgie CongoMalinova, Calahan Pak H, CRNA   Charge Capture section accepted, Clinical Note Signed, Visit diagnoses modified

## 2018-05-27 NOTE — Progress Notes (Addendum)
Subjective: Postpartum Day#1, Primary  LTCS for arrest of decent. GIRL, 9'4"   Patient reports some post op pain. Foley out at 5 am and not voided as off 10.55 AM Lochia moderate Breast feeding planned Tired but okay   Objective: Vital signs in last 24 hours: Temp:  [97.6 F (36.4 C)-98.9 F (37.2 C)] 98.2 F (36.8 C) (12/18 0848) Pulse Rate:  [60-93] 64 (12/18 0848) Resp:  [13-26] 18 (12/18 0848) BP: (120-148)/(68-105) 121/84 (12/18 0848) SpO2:  [96 %-100 %] 97 % (12/18 0355)  Physical Exam:  General: alert and cooperative  Lungs CTA bil CV RRR Lochia: appropriate Uterine Fundus: firm, nl bowel sounds  Incision: healing well DVT Evaluation: Negative Homan's sign.  CBC Latest Ref Rng & Units 05/27/2018 05/26/2018  WBC 4.0 - 10.5 K/uL 16.1(H) 11.3(H)  Hemoglobin 12.0 - 15.0 g/dL 10.5(L) 12.3  Hematocrit 36.0 - 46.0 % 31.8(L) 36.1  Platelets 150 - 400 K/uL 148(L) 150   AB(+) Rub Immune   Assessment/Plan: Status post Cesarean section, POD #1.  Need to assess voiding function. Bladder scan advised to RN and report back. May need foley for another day Post-op care and pain mngmt reviewed PNvitamins, PO Iron  Anxiety Hx- continue Celexa, good family support  GIRL, doing well, breast feeding. Lactation support  Robley FriesVaishali R Lilac Hoff 05/27/2018, 11:58 AM

## 2018-05-27 NOTE — Anesthesia Postprocedure Evaluation (Signed)
Anesthesia Post Note  Patient: Threasa Headsracy Pelfrey  Procedure(s) Performed: CESAREAN SECTION (N/A Abdomen)     Patient location during evaluation: Mother Baby Anesthesia Type: Epidural Level of consciousness: awake and alert Pain management: pain level controlled Vital Signs Assessment: post-procedure vital signs reviewed and stable Respiratory status: spontaneous breathing, nonlabored ventilation and respiratory function stable Cardiovascular status: stable Postop Assessment: no headache, no backache, epidural receding, no apparent nausea or vomiting, patient able to bend at knees, able to ambulate and adequate PO intake Anesthetic complications: no    Last Vitals:  Vitals:   05/27/18 0355 05/27/18 0848  BP: 128/79 121/84  Pulse: 64 64  Resp: 16 18  Temp: 37.1 C 36.8 C  SpO2: 97%     Last Pain:  Vitals:   05/27/18 0848  TempSrc: Oral  PainSc:    Pain Goal: Patients Stated Pain Goal: 1 (05/26/18 0413)               Laban EmperorMalinova,Mone Commisso Hristova

## 2018-05-28 ENCOUNTER — Encounter (HOSPITAL_COMMUNITY): Payer: Self-pay | Admitting: Obstetrics & Gynecology

## 2018-05-28 NOTE — Plan of Care (Signed)
  Problem: Role Relationship: Goal: Ability to demonstrate positive interaction with newborn will improve Note:  Patient using nipple shield that was started yesterday in order to get baby to latch. Patient states she is hand expressing some colostrum and her breasts seem fuller today. Patient has not been using hand pump and has not been set up with DEBP. Discussed with patient the importance of using DEBP while feeding with nipple shield.  Discussed milk supply and risk of decreased milk supply while using nipple shield. Baby is at a 7.6% weight loss this morning and has not fed since 0200. Encouraged mother to feed baby now and hand express colostrum to supplement baby with now and after every feeding. Mother stated that baby has been asleep since cluster feeding for six hours and that she needed to eat breakfast herself prior to feeding baby. Baby is calm in the crib but beginning to suck on fingers. Reported to lactation and encouraged patient to call when she finished eating for nurse to set up and explain DEBP. Also encouraged patient to not allow baby to sleep for such a long period of time; encouraged patient to wake baby to feed if she sleeps longer than just a few hours. Mother to call for assistance with pump and feeding as needed. Earl Galasborne, Linda HedgesStefanie MapletonHudspeth

## 2018-05-28 NOTE — Progress Notes (Signed)
POD# 2  S: Pt notes pain controlled w/ po meds, minimal lochia, nl void, out of bed w/o dizziness or chest pain, tol reg po, + flatus.  No BM yet. Pt is  Breastfeeding. Trouble voiding yesterday but reports not issues in the evening.   Pt notes right side pain from hip and radiating into upper thigh and up to rib cage  H/o gest htn, on no meds currently  Vitals:   05/27/18 1302 05/27/18 2245 05/28/18 0636 05/28/18 0638  BP: 131/77 139/80 (!) 85/52 (!) 92/55  Pulse: 69 70 67 68  Resp: 18 17 18 18   Temp: 99 F (37.2 C) 97.7 F (36.5 C)  97.8 F (36.6 C)  TempSrc: Oral Oral  Oral  SpO2:      Weight:      Height:        Gen: well appearing CV: RRR Pulm: CTAB Abd: soft, ND, approp tender, fundus below umbilicus, NT, no RUQ pain Inc: C/D/I LE: tr edema, NT, 3+ DTR  CBC    Component Value Date/Time   WBC 16.1 (H) 05/27/2018 0550   RBC 3.38 (L) 05/27/2018 0550   HGB 10.5 (L) 05/27/2018 0550   HCT 31.8 (L) 05/27/2018 0550   PLT 148 (L) 05/27/2018 0550   MCV 94.1 05/27/2018 0550   MCH 31.1 05/27/2018 0550   MCHC 33.0 05/27/2018 0550   RDW 14.0 05/27/2018 0550    A/P: POD#  2 s/p PCS for arrest descent, LGA. - spont labor, h/o gest htn, no meds, nl bps, no si/sx PEC, plan bp check in office in 1 wk - MSK pain, likely hyperextension from peanut/ labor maneuvers- plan rest, nsaids, stretching - post-op. Doing well.   Lendon ColonelKelly A Minyon Davies 05/28/2018 8:22 AM

## 2018-05-29 MED ORDER — MAGNESIUM OXIDE 400 (241.3 MG) MG PO TABS: 400.0000 mg | ORAL_TABLET | Freq: Every day | ORAL | 0 refills | Status: DC

## 2018-05-29 MED ORDER — IBUPROFEN 600 MG PO TABS: 600.0000 mg | ORAL_TABLET | Freq: Four times a day (QID) | ORAL | 0 refills | Status: DC | PRN

## 2018-05-29 MED ORDER — OXYCODONE-ACETAMINOPHEN 5-325 MG PO TABS: 1.0000 | ORAL_TABLET | ORAL | 0 refills | Status: DC | PRN

## 2018-05-29 MED ORDER — MAGNESIUM OXIDE 400 (241.3 MG) MG PO TABS
400.0000 mg | ORAL_TABLET | Freq: Every day | ORAL | Status: DC
  Filled 2018-05-29 (×2): qty 1

## 2018-05-29 NOTE — Progress Notes (Signed)
POSTOPERATIVE DAY # 3 S/P CS  S:         Reports feeling tired and little sore             Tolerating po intake / no nausea / no vomiting / + flatus / no BM             Bleeding is light             Pain controlled with Motrin and Oxycodone             Up ad lib / ambulatory/ voiding QS  Newborn Breast   O:  VS: BP 119/83 (BP Location: Left Arm)   Pulse 65   Temp 98 F (36.7 C) (Oral)   Resp 18   Ht 5\' 7"  (1.702 m)   Wt 111 kg   LMP 07/11/2017   SpO2 97%   Breastfeeding Unknown   BMI 38.34 kg/m                 Physical Exam:             Alert and Oriented X3  Lungs: Clear and unlabored  Heart: regular rate and rhythm / no mumurs  Abdomen: soft, non-tender, non-distended, active BS             Fundus: firm, non-tender, U-1             Dressing intact              Incision:  approximated with suture / no erythema / no ecchymosis / no drainage  Lochia: light  Extremities: no edema, no calf pain or tenderness  A:        POD # 3 S/P CS  P:        Routine postoperative care              DC home - DC instructions / WOB booklet    Marlinda Mikeanya Graves Nipp CNM, MSN, [facility] 05/29/2018, 6:40 AM

## 2018-05-29 NOTE — Discharge Summary (Signed)
OB Discharge Summary  Patient Name: Brandi Davies DOB: 1985-09-26 MRN: [id number]  Date of admission: 05/26/2018  Admitting diagnosis: 40WKS CTX Intrauterine pregnancy: 5473w0d     Secondary diagnosis: Gestational Hypertension   Date of discharge: 05/29/2018    Discharge diagnosis: Term Pregnancy Delivered and POD 3 s/p CS arrest of labor      Prenatal history: G1P1001   EDC : 05/26/2018, by Other Basis  [facility]  Primary provider : Mody Prenatal course complicated by GBS (+) / gestational hypertension  Prenatal Labs: ABO, Rh: --/--/AB POS, AB POS Performed at [facility], [address] [place], [place] [address]  [phone](12/17 0143) Antibody: NEG (12/17 0143) Rubella: Immune (07/25 0000)  RPR: Non Reactive (12/17 0143)  HBsAg: Negative (07/25 0000)  HIV: Non-reactive (07/25 0000)  GBS: Positive (11/19 0000)                                    Hospital course:  Onset of Labor With Unplanned C/S  32 y.o. yo G1P1001 at 7473w0d was admitted in Active Labor on 05/26/2018. Patient had a labor course significant for arrest of descent in mid-pelvis / transverse arrest. Membrane Rupture Time/Date: 9:36 AM ,05/26/2018  The patient went for cesarean section due to Arrest of Descent, and delivered a Viable infant,05/26/2018 Details of operation can be found in separate operative note.   Patient had an uncomplicated postpartum course with stable BP / normal PIH labs on admit.  She is ambulating,tolerating a regular diet, passing flatus, and urinating well.  Patient is discharged home in stable condition 05/29/18.  Delivering PROVIDER: MODY, VAISHALI                                                            Complications: None  Newborn Data: Live born female  Birth Weight: 9 lb 4.7 oz (4215 g) APGAR: 8, 8  Newborn Delivery   Birth date/time:  05/26/2018 14:08:00 Delivery type:  C-Section, Low Transverse Trial of labor:  Yes C-section  categorization:  Primary     Baby Feeding: Breast Disposition:home with mother  Post partum procedures:none  Labs: Lab Results  Component Value Date   WBC 16.1 (H) 05/27/2018   HGB 10.5 (L) 05/27/2018   HCT 31.8 (L) 05/27/2018   MCV 94.1 05/27/2018   PLT 148 (L) 05/27/2018   CMP Latest Ref Rng & Units 05/26/2018  Glucose 70 - 99 mg/dL 86  BUN 6 - 20 mg/dL 12  Creatinine 5.620.44 - 1.301.00 mg/dL 8.650.54  Sodium 784135 - 696145 mmol/L 135  Potassium 3.5 - 5.1 mmol/L 4.2  Chloride 98 - 111 mmol/L 108  CO2 22 - 32 mmol/L 20(L)  Calcium 8.9 - 10.3 mg/dL 2.9(B8.4(L)  Total Protein 6.5 - 8.1 g/dL 6.0(L)  Total Bilirubin 0.3 - 1.2 mg/dL 0.4  Alkaline Phos 38 - 126 U/L 100  AST 15 - 41 U/L 20  ALT 0 - 44 U/L 19      Physical Exam @ time of discharge:  Vitals:   05/28/18 1600 05/28/18 2245 05/28/18 2328 05/29/18 0536  BP: 105/78 (!) 142/98 123/87 119/83  Pulse: 77 64 62 65  Resp: 18 16 18  18  Temp: 98.4 F (36.9 C) 98.4 F (36.9 C)  98 F (36.7 C)  TempSrc: Oral   Oral  SpO2:      Weight:      Height:        General: alert, cooperative and no distress Lochia: appropriate Uterine Fundus: firm Perineum: intact Incision: Healing well with no significant drainage Extremities: DVT Evaluation: No evidence of DVT seen on physical exam.   Discharge instructions:  "Baby and Me Booklet" and [facility] Booklet  Discharge Medications:  Allergies as of 05/29/2018      Reactions   Sulfa Antibiotics Swelling      Medication List    STOP taking these medications   calcium carbonate 500 MG chewable tablet Commonly known as:  TUMS - dosed in mg elemental calcium     TAKE these medications   albuterol 108 (90 Base) MCG/ACT inhaler Commonly known as:  PROVENTIL HFA;VENTOLIN HFA Inhale 2 puffs into the lungs every 6 (six) hours as needed for wheezing or shortness of breath.   citalopram 20 MG tablet Commonly known as:  CELEXA Take 1 tablet (20 mg total) by mouth daily.   docusate  sodium 100 MG capsule Commonly known as:  COLACE Take 100 mg by mouth 2 (two) times daily.   famotidine 10 MG tablet Commonly known as:  PEPCID Take 10 mg by mouth at bedtime.   ibuprofen 600 MG tablet Commonly known as:  ADVIL,MOTRIN Take 1 tablet (600 mg total) by mouth every 6 (six) hours as needed for moderate pain.   magnesium oxide 400 (241.3 Mg) MG tablet Commonly known as:  MAG-OX Take 1 tablet (400 mg total) by mouth daily.   oxyCODONE-acetaminophen 5-325 MG tablet Commonly known as:  PERCOCET/ROXICET Take 1-2 tablets by mouth every 4 (four) hours as needed for moderate pain.   prenatal multivitamin Tabs tablet Take 2 tablets by mouth daily at 12 noon.       Diet: routine diet  Activity: Advance as tolerated. Pelvic rest x 6 weeks.   Follow up:December 23 for BP recheck - call office to schedule    Signed: Marlinda Mikeanya Juliann Olesky CNM, MSN, [facility] 05/29/2018, 7:09 AM

## 2018-06-09 ENCOUNTER — Telehealth (HOSPITAL_COMMUNITY): Payer: Self-pay

## 2018-06-09 ENCOUNTER — Ambulatory Visit: Payer: Self-pay

## 2018-06-09 ENCOUNTER — Telehealth: Payer: Self-pay | Admitting: Family Medicine

## 2018-06-09 NOTE — Lactation Note (Signed)
This note was copied from a baby's chart. Lactation Consultation Note  Patient Name: Brandi Davies Today's Date: 06/09/2018     Maternal Data    Feeding    LATCH Score                   Interventions    Lactation Tools Discussed/Used     Consult Status      Silas FloodSharon S Krikor Willet 06/09/2018, 11:02 AM

## 2018-06-09 NOTE — Telephone Encounter (Signed)
Telephone call. No answer. Left voicemail with contact information. Patient left a voicemail on our lactation line regarding setting up an outpatient appointment.

## 2018-06-12 NOTE — Telephone Encounter (Signed)
Called patient to inform of appointment, no answer left a message to call our clinic.

## 2018-06-15 ENCOUNTER — Ambulatory Visit: Payer: Self-pay

## 2018-06-15 NOTE — Lactation Note (Signed)
This note was copied from a baby's chart. 06/15/2018  Name: Brandi Davies MRN: [id number] Date of Birth: 05/26/2016 Gestational Age: Gestational Age: [redacted]w[redacted]d Birth Weight: 148.7 oz Weight today:    8 pounds 15 ounces (4054 grams) with clean newborn diaper  Brandi Davies is a 67 day old term infant presents today with mom for feeding assessment. Mom is concerned with infant weights and wants to know if infant is getting enough. Infant ate some on the right breast before appointment.   Infant self awakens to feed every 1-4 hours and feeds on one or both breasts with each feeding. Mom is using the nipple shield with each feeding. Mom feels some softening with feeding, sometimes more that others. Infant is offered one bottle a day since Friday and weight has improved. Sometimes mom BF prior to bottle and sometimes she does not.   Infant has gained 219 grams in the last 18 days with an average daily weight gain of 12 grams a day. Mom reports her last weight was on Thursday 1/2 by [facility] and was 8 pounds 12 ounces. With that weight infant has gained 3 ounces in the last 4 days.  Infant has grown 1.5 inches since birth per mom. Infant is 161 grams below birth weight.   Infant latched to the left breast without the NS and latched easily. Infant self detached and nipple was round and longer. Mom denied pain with feeding. Infant sleepy at the breast. Reviewed awakening techniques, feeding STS, and massaging breast with feeding. Mom able to latch infant without the NS today with ease. Infant latched for about 10 minutes and transferred 24 ml. Infant was reawakened and relatched to the left breast. Mom felt infant more alert with the second phase of the feeding. Infant was reawakened and then fed on the right breast in the cross cradle hold without the NS and transferred ml. Mom did have to use a lot of stimulation with feeding. Mom reports this is a slower feeding than normal for infant.   Mom reports her  breasts are usually not emptied well after breast feeding infant. Discussed importance of pumping 3-4 times a day post breast feeding to protect milk supply until infant transferring better. Mom has milk in the freezer and is using some to feed infant.   Mom feels she is eating ok. Mom is drinking plenty of water. Mom has support of family and dad. Dad is back to work.  Mom is taking 13 week off of work.   Infant to follow up with Calla Kicks, NP on 1/21. Mom reports [facility] will come back out for another weight check if wanted, enc mom to have them come late next week for weight check. Mom aware of BF Support Groups. Infant to follow up with Lactation in 1 week.   Mom reports all questions/concerns have been answered. Mom to call with questions as needed.      General Information: Mother's reason for visit: Feeding assessment, slow weight gain in the infant Consult: Initial Lactation consultant: Noralee Stain RN,IBCLC Breastfeeding experience: feeding every 1-4 hours, sleepy at the breast, self awakens to feed   Maternal medications: Other, Pre-natal vitamin, Stool softener(Celexa 20 mg)  Breastfeeding History: Frequency of breast feeding: ewvery 1-4 hours Duration of feeding: 20-40 minutes  Supplementation: Supplement method: bottle(Dr. Brown's Preemie nipple)         Breast milk volume: 2-4 ounces Breast milk frequency: 1-2 x a day Total breast milk volume per day: 2-4 ounces  Pump type: Spectra(Spectra 2) Pump frequency: 1 x a day for 30 mintues Pump volume: 2 ounces post feeds, 4 ounces if not BF first  Infant Output Assessment: Voids per 24 hours: 8+ Urine color: Clear yellow Stools per 24 hours: 4 Stool color: Yellow  Breast Assessment: Breast: Soft, Compressible Nipple: Erect Pain level: 1 Pain interventions: Bra, Lanolin  Feeding Assessment: Infant oral assessment: Variance Infant oral assessment comment: Infant with thin labial frenulum that inserts at  the bottom of the gum ridge, upper lip with some tightness with flanging. Red ring around upper lip after feeding. Sucking blister noted to upper center lip. Tongue with good mobility. Infant may have decreased mid tongue elevation, will reassess at next feeding. Oral structures were not discussed with mom at this time.  Positioning: Cross cradle(left breast, 20 minutes) Latch: 1 - Repeated attempts needed to sustain latch, nipple held in mouth throughout feeding, stimulation needed to elicit sucking reflex. Audible swallowing: 2 - Spontaneous and intermittent Type of nipple: 2 - Everted at rest and after stimulation Comfort: 2 - Soft/non-tender Hold: 1 - Assistance needed to correctly position infant at breast and maintain latch LATCH score: 8 Latch assessment: Deep Lips flanged: Yes Suck assessment: Displays both   Pre-feed weight: 4054 grams/4090 grams Post feed weight: 4090 grams/4096 grams Amount transferred: 36 ml + 6 ml Amount supplemented: 0  Additional Feeding Assessment: Infant oral assessment: Variance Infant oral assessment comment: see above Positioning: Cross cradle(right breast, ) Latch: 2 - Grasps breast easily, tongue down, lips flanged, rhythmical sucking. Audible swallowing: 2 - Spontaneous and intermittent Type of nipple: 2 - Everted at rest and after stimulation Comfort: 2 - Soft/non-tender Hold: 2 - No assistance needed to correctly position infant at breast LATCH score: 10 Latch assessment: Deep Lips flanged: Yes Suck assessment: Displays both   Pre-feed weight: 4090 grams Post feed weight: 4090 grams Amount transferred: 0 Amount supplemented: 0  Totals: Total amount transferred: 42 ml Total supplement given: 0 Total amount pumped post feed: did not pump  1. Offer the breast with feeding cues. Offer both breasts with each feeding 2. Keep infant awake at the breast as needed 3. Feed infant Skin to skin at the breast 4. Massage/compress breast with  feeding 5. Empty the first breast before offering the second breast. 6. Offer infant a bottle of pumped breast milk if she is still cueing to feed after breast feeding. Continue using the Dr. Theora GianottiBrown's nipple 7. Infant needs about 75-100 ml (2.5-3.5 ounces) for 8 feedings a day or 600-800 ml (20-26 ounces) in 24 hours. Infant may need more or less depending on how often she feeds.  8. Continue to pump 3-4 x a day for about 15 minutes with the double electric breast pump, offer all pumped milk to infant until she is gaining more consistently. It is recommended that you pump 3-4 x a day if using the nipple shield with feedings to protect milk supply. It is recommended that you pump each time infant is getting a bottle.  9. Keep up the good work 10. Please call with any questions/concerns [phone] 11. Thank you for allowing me to assist you today 12. Follow up with Lactation 1 week  Ed BlalockSharon S Stark Aguinaga RN, [facility]  Silas Flood Merland Holness 06/15/2018, 2:15 PM

## 2018-06-22 ENCOUNTER — Ambulatory Visit: Payer: Self-pay

## 2018-06-22 NOTE — Lactation Note (Signed)
This note was copied from a baby's chart.  06/22/2018  Name: Patricia Nettlellinor Virginia Wiers MRN: [id number] Date of Birth: 05/26/2016 Gestational Age: Gestational Age: 6962w0d Birth Weight: 148.7 oz Weight today:    9 pounds 8.6 ounces (4326 grams) with clean size 1 diaper  Arna Mediciora is a 5927 day old term infant who presents today with mom for follow up feeding assessment. Mom reports BF has improved since her last visit. Mom feels infant is more awake at the breast and infant is being fed STS. Infant has weaned off the nipple shield. Mom did have some sore nipples after weaning the nipples shield and they have now improved.   Infant has gained 272 grams in the last 7 days with an average daily weight gain of 39 grams a day. Infant has now over her birthweight.  Mom reports infant self awakens to feed every 2-3 hours. She takes up to an hour to feed at times. Infant is feeding from both breasts with each feeding and being supplemented with 1 bottle a day of milk that mom has pumped.   Mom latched infant to the right breast in the cross cradle hold. Infant popping on and off the breast. Infant placed in the football hold and maintained latch better. Infant with some intermittent clicking with feeding. Mom denied pain with feeding, nipple was rounded post feeding. Infant transferred 32 ml on the right breast. Infant then latched to the left breast in the cross cradle hold. Infant fed more actively on the left breast although she tends to eat in short bursts and pulls on and off the breast.   Enc mom to continue pumping 3-4 x a day to protect milk supply.   Mom is getting exhausted with long frequent feedings. Enc mom to have infant evaluated by oral specialist. Infant is feeding every other hour and sleeping very little. Discussed mom doing some pumping and bottle feeding infant as needed to be able to get some rest.   Reviewed pumping and returning to work.    Infant to follow up with Pediatrician on 1/21.  [facility] to come out on Thursday or Friday this week. Mom aware of BF Support Groups. Infant to follow up with Lactation 1-5 days post tongue and lip releases if completed and as needed.       General Information: Mother's reason for visit: Follow up feeding assessment, weight check Consult: Follow-up Lactation consultant: Noralee StainSharon Shaketha Jeon RN,IBCLC Breastfeeding experience: feeding every 2 hours, self awakening   Maternal medications: Pre-natal vitamin, Stool softener, Other(Celexa 20 mg po qd)  Breastfeeding History: Frequency of breast feeding: every 2 hours Duration of feeding: 40-60 minutes  Supplementation: Supplement method: bottle(Dr. Brown's Preemie)         Breast milk volume: 1 ounce Breast milk frequency: 1 x a day Total breast milk volume per day: 1 ounce Pump type: Spectra Pump frequency: 1 x a day  Pump volume: 1 ounce post feed  Infant Output Assessment: Voids per 24 hours: 8+ Urine color: Clear yellow Stools per 24 hours: 4 Stool color: Yellow  Breast Assessment: Breast: Soft, Compressible Nipple: Erect Pain level: 0 Pain interventions: Bra, Coconut oil  Feeding Assessment: Infant oral assessment: Variance Infant oral assessment comment: Infant with thin labial frenulum that inserts at the bottom of the gum ridge. Upper lip with some tightness with flanging, upper lip flanges well on the breast. Both nipples are slightly compressed post feeding, infant has weaned off the nipple shield.  Infant with strong suckle  with good tongue extension and cupping noted on gloved finger.  Infant with quivering to lower jaw between feeds, she takes a long time to feed for all feedings. Infant with tongue thrusting on the breast and on gloved finger. infant may have some decreased mid tongue elevation. mom given information on tongue and lip restrictions and local provider list. Enc mom to learn and educate herself and pursue if she and dad  wants.  Positioning:  Football(right breast, 15 minutes) Latch: 1 - Repeated attempts needed to sustain latch, nipple held in mouth throughout feeding, stimulation needed to elicit sucking reflex. Audible swallowing: 2 - Spontaneous and intermittent Type of nipple: 2 - Everted at rest and after stimulation Comfort: 2 - Soft/non-tender Hold: 1 - Assistance needed to correctly position infant at breast and maintain latch LATCH score: 8 Latch assessment: Deep Lips flanged: Yes Suck assessment: Displays both   Pre-feed weight: 4326 grams Post feed weight: 4358 grams Amount transferred: 32 ml Amount supplemented: 0  Additional Feeding Assessment: Infant oral assessment: Variance Infant oral assessment comment: see above Positioning: Cross cradle(left breast, 20 minutes) Latch: 2 - Grasps breast easily, tongue down, lips flanged, rhythmical sucking. Audible swallowing: 2 - Spontaneous and intermittent Type of nipple: 2 - Everted at rest and after stimulation Comfort: 2 - Soft/non-tender Hold: 1 - Assistance needed to correctly position infant at breast and maintain latch LATCH score: 9 Latch assessment: Deep Lips flanged: Yes Suck assessment: Displays both Tools: Pump Pre-feed weight: 4358 grams/4330 grams after diaper change Post feed weight: 4376 grams/4344 grams Amount transferred: 18 ml + 14 ml    Totals: Total amount transferred: 64 ml Total supplement given: 0 Total amount pumped post feed: did not pump   Plan  1. Offer the breast with feeding cues. Offer both breasts with each feeding 2. Keep infant awake at the breast as needed 3. Feed infant Skin to skin at the breast 4. Massage/compress breast with feeding 5. Empty the first breast before offering the second breast. 6. Offer infant a bottle of pumped breast milk if she is still cueing to feed after breast feeding. Continue using the Dr. Theora Gianotti nipple 7. Infant needs about 81-108 ml (2.5-3.5 ounces) for 8 feedings a day or 645-860 ml  (22-29 ounces) in 24 hours. Infant may need more or less depending on how often she feeds.  8. Continue to pump 3-4 x a day for about 15 minutes with the double electric breast pump, offer all pumped milk to infant until she is gaining more consistently. It is recommended that you pump each time infant is getting a bottle.  9. Keep up the good work 10. Please call with any questions/concerns [phone] 11. Thank you for allowing me to assist you today 12. Follow up with Lactation as needed or 1-5 days post tongue and lip releases if completed   [facility] RN, IBCLC                                                           Silas Flood Nzinga Ferran 06/22/2018, 11:46 AM

## 2018-06-23 ENCOUNTER — Other Ambulatory Visit: Payer: Self-pay | Admitting: Obstetrics and Gynecology

## 2018-06-24 NOTE — Telephone Encounter (Signed)
Medication refill request: Citalopram 20 mg daily Last AEX:  07/16/2017 Next AEX: 08/06/2018 Last MMG (if hormonal medication request): N/A Refill authorized: Citalopram #1 1RF until aex.  Please refill if appropriate.

## 2018-07-10 ENCOUNTER — Ambulatory Visit: Payer: Self-pay

## 2018-07-10 NOTE — Lactation Note (Signed)
This note was copied from a baby's chart. 07/10/2018  Name: Brandi Davies MRN: [id number] Date of Birth: 05/26/2016 Gestational Age: Gestational Age: 7816w0d Birth Weight: 148.7 oz Weight today:    10 pounds 14.2 ounces (4938 grams) with clean size 751 diaper   576 week old infant presents today with mom for follow up feeding assessment. Infant post tongue/ lip releases on 1/27 by Brandi Davies.   Infant has gained 612 grams in the last 18 days with an average daily weight gain of 34 grams a day.   Mom reports infant was cranky the day after and some yesterday. Mom and dad are performing stretches as prescribed per Brandi Davies. Infant is tolerating stretches better today. Infant receiving Tylenol as needed.   Mom reports latch was much better right after procedure and has been worse in the last few days. Infant is feeding for long periods and mom is experiencing pain with feeding. Infant is staying awake better with feedings now. She is starting to sleep for  few 3 hour stretches now. Infant is no longer needing supplement after breast feeding.   Discussed with mom the typical healing time for tongue and lip releases and that feeding should improve with time. Enc mom to use NS if needed. Mom using Coconut oil to nipples, nipples are worse in the last few days since infant procedure. Mom denies shooting pains in her breasts. Informed mom of APNO and can call her OB if her nipples are not healing. Comfort Gels given with instructions for use and cleaning.   Infant latched to both breasts in the cross cradle hold. Infant feeds in short bursts and then pulls off. Mom with nipple compression post feeding. Enc mom to flange lips after latch and massage/compress breast with feeding. Enc mom to rotate positions with feedings as she is able. Mom and infant do not like the foot ball hold as infant is long and difficult to position. Infant does not stay latched well in the laying down position. Infant does  well in the laid back position.   Infant with granulation tissue to upper lip. Upper lip is tight with flanging. Tongue with diamond shaped granulation tissue. Infant with some snapback with initial suckling that improves with suckling. Mom shown how to perform suck training exercises and handout given.   Infant to follow up with Brandi Davies on 2/10. Infant to follow up with pediatrician at 2 months. Infant to follow up with Lactation as needed.   Mom to call with questions/concerns as needed.     General Information: Mother's reason for visit: Follow up feeding assessment, post tongue and lip releases on 1/27 Consult: Follow-up Lactation consultant: Brandi StainSharon Ruthe Roemer RN,IBCLC Breastfeeding experience: Feeding frequently, starting to take longer stretches at times   Maternal medications: Pre-natal vitamin, Stool softener, Other(Celexa 20 mg QD)  Breastfeeding History: Frequency of breast feeding: every 2-3 hours Duration of feeding: 40-60 minutes  Supplementation:                 Pump type: Spectra Pump frequency: 1 x a day  Pump volume: 5 ounces in the am  Infant Output Assessment: Voids per 24 hours: 8+ Urine color: Clear yellow Stools per 24 hours: 3-4 Stool color: Yellow  Breast Assessment: Breast: Soft, Non-compressible Nipple: Erect, Reddened Pain level: 3(mainly with latch) Pain interventions: Bra, Coconut oil  Feeding Assessment: Infant oral assessment: Variance Infant oral assessment comment: see note Positioning: Cross cradle(left breast, 20 minutes) Latch: 1 - Repeated attempts needed  to sustain latch, nipple held in mouth throughout feeding, stimulation needed to elicit sucking reflex. Audible swallowing: 2 - Spontaneous and intermittent Type of nipple: 2 - Everted at rest and after stimulation Comfort: 1 - Filling, red/small blisters or bruises, mild/mod discomfort Hold: 2 - No assistance needed to correctly position infant at breast LATCH score:  8 Latch assessment: Deep Lips flanged: No(upper lip needs flanging) Suck assessment: Displays both   Pre-feed weight: 4938 grams Post feed weight: 4998 grams Amount transferred: 60 ml Amount supplemented: 0  Additional Feeding Assessment: Infant oral assessment: Variance Infant oral assessment comment: see above Positioning: Cross cradle(right breast, 15 minutes) Latch: 1 - Repeated attempts neede to sustain latch, nipple held in mouth throughout feeding, stimulation needed to elicit sucking reflex. Audible swallowing: 2 - Spontaneous and intermittent Type of nipple: 2 - Everted at rest and after stimulation Comfort: 2 - Soft/non-tender Hold: 2 - No assistance needed to correctly position infant at breast LATCH score: 9 Latch assessment: Shallow Lips flanged: No(upper lip needs flanging) Suck assessment: Displays both   Pre-feed weight: 4998 grams Post feed weight: 5018 grams Amount transferred: 20 ml Amount supplemented: 0  Totals: Total amount transferred: 80 ml Total supplement given: 0 Total amount pumped post feed: did not pump   Plan:   1. Offer the breast with feeding cues. Offer both breasts with each feeding as infant wants.  2. Keep infant awake at the breast as needed 3. Feed infant Skin to skin at the breast 4. Massage/compress breast with feeding 5. Empty the first breast before offering the second breast. 6. Offer infant a bottle of pumped breast milk if she is still cueing to feed after breast feeding. Continue using the Dr. Theora Davies nipple 7. Infant needs about 92-123 ml (3-4 ounces) for 8 feedings a day or 735-980 ml (25-30 ounces) in 24 hours. Infant may need more or less depending on how often she feeds.  8. Continue to pump 2-3 x a day for about 15 minutes with the double electric breast pump, offer all pumped milk to infant until she is gaining more consistently. It is recommended that you pump each time infant is getting a bottle.  9. Continue  Stretches per Dr. Orland Davies 10. Suck training exercises 5-6 times a day for 1-2 minutes per exercise for 2-3 weeks until tongue and lip are healed 11. Can call OB for All Purpose Nipple Ointment if nipples are not feeling better in the next few days 12. Keep up the good work 13. Please call with any questions/concerns [phone] 14. Thank you for allowing me to assist you today 15. Follow up with Lactation as needed   Ed Blalock RN, IBCLC                                                              Silas Flood [place] 07/10/2018, 10:48 AM

## 2018-08-04 NOTE — Progress Notes (Signed)
33 y.o. G47P1001 Married White or Caucasian Not Hispanic or Latino female here for annual exam.  She has a 74 month old baby girl, nursing. On the minipill. Doing well.  She had a C/S in 12/19. She has a vulvar varicosity.  Some issues with constipation. Taking colace which helps. Voiding fine, no leakage.     Patient's last menstrual period was 08/30/2017.          Sexually active: No.  The current method of family planning is OCP (estrogen/progesterone).    Exercising: Yes.    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  TDaP: 07/16/2017 Gardasil: No   reports that she has never smoked. She has never used smokeless tobacco. She reports current alcohol use of about 2.0 - 3.0 standard drinks of alcohol per week. She reports that she does not use drugs. She is a Advice worker, has her masters. Working for the [employer], goes back to work in a couple of weeks.   Past Medical History:  Diagnosis Date  . Abnormal Pap smear of cervix   . Anxiety   . Anxiety 05/26/2018  . Asthma   . Asthma, chronic 05/26/2018    Past Surgical History:  Procedure Laterality Date  . CESAREAN SECTION N/A 05/26/2018   Procedure: CESAREAN SECTION;  Surgeon: Shea Evans, MD;  Location: [facility];  Service: Obstetrics;  Laterality: N/A;  . MOUTH SURGERY    . WISDOM TOOTH EXTRACTION      Current Outpatient Medications  Medication Sig Dispense Refill  . albuterol (PROVENTIL HFA;VENTOLIN HFA) 108 (90 Base) MCG/ACT inhaler Inhale 2 puffs into the lungs every 6 (six) hours as needed for wheezing or shortness of breath. 1 Inhaler 2  . citalopram (CELEXA) 20 MG tablet Take 1 tablet (20 mg total) by mouth daily. 90 tablet 3  . docusate sodium (COLACE) 100 MG capsule Take 100 mg by mouth 2  (two) times daily.    . norethindrone (MICRONOR,CAMILA,ERRIN) 0.35 MG tablet Take 1 tablet by mouth daily.    . Prenatal Vit-Fe Fumarate-FA (PRENATAL MULTIVITAMIN) TABS tablet Take 2 tablets by mouth daily at 12 noon.     No current facility-administered medications for this visit.     Family History  Problem Relation Age of Onset  . Stomach cancer Maternal Grandmother   . Stomach cancer Maternal Grandfather   . Stroke Paternal Grandmother   . Heart disease Paternal Grandmother   . Heart attack Paternal Grandmother   . Heart attack Paternal Grandfather     Review of Systems  Constitutional: Negative.   HENT: Negative.   Eyes: Negative.   Respiratory: Negative.   Cardiovascular: Negative.   Gastrointestinal: Negative.   Endocrine: Negative.   Genitourinary: Negative.   Musculoskeletal: Negative.   Skin: Negative.   Allergic/Immunologic: Negative.   Neurological: Negative.   Hematological: Negative.   Psychiatric/Behavioral: Negative.     Exam:   BP 122/70 (BP Location: Right Arm, Patient Position: Sitting, Cuff Size: Normal)   Pulse 72   Ht 5\' 7"  (1.702 m)   Wt 219 lb 9.6 oz (99.6 kg)   LMP 08/30/2017   Breastfeeding Yes   BMI 34.39 kg/m   Weight change: @WEIGHTCHANGE @ Height:   Height: 5\' 7"  (170.2 cm)  Ht Readings from Last 3 Encounters:  08/06/18 5\' 7"  (1.702 m)  05/26/18 5\' 7"  (1.702 m)  07/16/17 5' 6.5" (1.689 m)    General appearance: alert, cooperative and appears stated age Head: Normocephalic, without obvious abnormality, atraumatic Neck: no adenopathy, supple, symmetrical, trachea midline and thyroid normal to inspection and palpation Lungs: clear to auscultation bilaterally Cardiovascular: regular rate and rhythm Breasts: normal appearance, no masses or tenderness Abdomen: soft, non-tender; non distended,  no masses,  no organomegaly Extremities: extremities normal, atraumatic, no cyanosis or edema Skin: Skin color, texture, turgor normal. No rashes  or lesions Lymph nodes: Cervical, supraclavicular, and axillary nodes normal. No abnormal inguinal nodes palpated Neurologic: Grossly normal   Pelvic: External genitalia:  no lesions              Urethra:  normal appearing urethra with no masses, tenderness or lesions              Bartholins and Skenes: normal                 Vagina: normal appearing vagina with normal color and discharge, no lesions              Cervix: no lesions               Bimanual Exam:  Uterus:  normal size, contour, position, consistency, mobility, non-tender              Adnexa: no mass, fullness, tenderness               Rectovaginal: deferred   Chaperone was present for exam.  A:  Well Woman with normal exam  H/O LEEP  H/O anxiety, controlled on Celexa  P:   Pap with hpv  Continue on POP  Continue Celexa  She will call if she needs a refill on her scripts  No labs this year

## 2018-08-06 ENCOUNTER — Encounter: Payer: Self-pay | Admitting: Obstetrics and Gynecology

## 2018-08-06 ENCOUNTER — Other Ambulatory Visit (HOSPITAL_COMMUNITY)
Admission: RE | Admit: 2018-08-06 | Discharge: 2018-08-06 | Disposition: A | Discharge status: Home / Self Care | Payer: 59 | Source: Ambulatory Visit | Attending: Obstetrics and Gynecology | Admitting: Obstetrics and Gynecology

## 2018-08-06 ENCOUNTER — Other Ambulatory Visit: Payer: Self-pay

## 2018-08-06 ENCOUNTER — Ambulatory Visit: Payer: 59 | Admitting: Obstetrics and Gynecology

## 2018-08-06 VITALS — BP 122/70 | HR 72 | Ht 67.0 in | Wt 219.6 lb

## 2018-08-06 DIAGNOSIS — Z124 Encounter for screening for malignant neoplasm of cervix: Secondary | ICD-10-CM

## 2018-08-06 DIAGNOSIS — Z01419 Encounter for gynecological examination (general) (routine) without abnormal findings: Secondary | ICD-10-CM | POA: Diagnosis not present

## 2018-08-06 NOTE — Patient Instructions (Signed)
EXERCISE AND DIET:  We recommended that you start or continue a regular exercise program for good health. Regular exercise means any activity that makes your heart beat faster and makes you sweat.  We recommend exercising at least 30 minutes per day at least 3 days a week, preferably 4 or 5.  We also recommend a diet low in fat and sugar.  Inactivity, poor dietary choices and obesity can cause diabetes, heart attack, stroke, and kidney damage, among others.    ALCOHOL AND SMOKING:  Women should limit their alcohol intake to no more than 7 drinks/beers/glasses of wine (combined, not each!) per week. Moderation of alcohol intake to this level decreases your risk of breast cancer and liver damage. And of course, no recreational drugs are part of a healthy lifestyle.  And absolutely no smoking or even second hand smoke. Most people know smoking can cause heart and lung diseases, but did you know it also contributes to weakening of your bones? Aging of your skin?  Yellowing of your teeth and nails?  CALCIUM AND VITAMIN D:  Adequate intake of calcium and Vitamin D are recommended.  The recommendations for exact amounts of these supplements seem to change often, but generally speaking 1,000 mg of calcium (between diet and supplement) and 800 units of Vitamin D per day seems prudent. Certain women may benefit from higher intake of Vitamin D.  If you are among these women, your doctor will have told you during your visit.    PAP SMEARS:  Pap smears, to check for cervical cancer or precancers,  have traditionally been done yearly, although recent scientific advances have shown that most women can have pap smears less often.  However, every woman still should have a physical exam from her gynecologist every year. It will include a breast check, inspection of the vulva and vagina to check for abnormal growths or skin changes, a visual exam of the cervix, and then an exam to evaluate the size and shape of the uterus and  ovaries.  And after 31 years of age, a rectal exam is indicated to check for rectal cancers. We will also provide age appropriate advice regarding health maintenance, like when you should have certain vaccines, screening for sexually transmitted diseases, bone density testing, colonoscopy, mammograms, etc.   MAMMOGRAMS:  All women over 40 years old should have a yearly mammogram. Many facilities now offer a "3D" mammogram, which may cost around $50 extra out of pocket. If possible,  we recommend you accept the option to have the 3D mammogram performed.  It both reduces the number of women who will be called back for extra views which then turn out to be normal, and it is better than the routine mammogram at detecting truly abnormal areas.    COLON CANCER SCREENING: Now recommend starting at age 45. At this time colonoscopy is not covered for routine screening until 50. There are take home tests that can be done between 45-49.   COLONOSCOPY:  Colonoscopy to screen for colon cancer is recommended for all women at age 50.  We know, you hate the idea of the prep.  We agree, BUT, having colon cancer and not knowing it is worse!!  Colon cancer so often starts as a polyp that can be seen and removed at colonscopy, which can quite literally save your life!  And if your first colonoscopy is normal and you have no family history of colon cancer, most women don't have to have it again for   10 years.  Once every ten years, you can do something that may end up saving your life, right?  We will be happy to help you get it scheduled when you are ready.  Be sure to check your insurance coverage so you understand how much it will cost.  It may be covered as a preventative service at no cost, but you should check your particular policy.      Breast Self-Awareness Breast self-awareness means being familiar with how your breasts look and feel. It involves checking your breasts regularly and reporting any changes to your  health care provider. Practicing breast self-awareness is important. A change in your breasts can be a sign of a serious medical problem. Being familiar with how your breasts look and feel allows you to find any problems early, when treatment is more likely to be successful. All women should practice breast self-awareness, including women who have had breast implants. How to do a breast self-exam One way to learn what is normal for your breasts and whether your breasts are changing is to do a breast self-exam. To do a breast self-exam: Look for Changes  1. Remove all the clothing above your waist. 2. Stand in front of a mirror in a room with good lighting. 3. Put your hands on your hips. 4. Push your hands firmly downward. 5. Compare your breasts in the mirror. Look for differences between them (asymmetry), such as: ? Differences in shape. ? Differences in size. ? Puckers, dips, and bumps in one breast and not the other. 6. Look at each breast for changes in your skin, such as: ? Redness. ? Scaly areas. 7. Look for changes in your nipples, such as: ? Discharge. ? Bleeding. ? Dimpling. ? Redness. ? A change in position. Feel for Changes Carefully feel your breasts for lumps and changes. It is best to do this while lying on your back on the floor and again while sitting or standing in the shower or tub with soapy water on your skin. Feel each breast in the following way:  Place the arm on the side of the breast you are examining above your head.  Feel your breast with the other hand.  Start in the nipple area and make  inch (2 cm) overlapping circles to feel your breast. Use the pads of your three middle fingers to do this. Apply light pressure, then medium pressure, then firm pressure. The light pressure will allow you to feel the tissue closest to the skin. The medium pressure will allow you to feel the tissue that is a little deeper. The firm pressure will allow you to feel the tissue  close to the ribs.  Continue the overlapping circles, moving downward over the breast until you feel your ribs below your breast.  Move one finger-width toward the center of the body. Continue to use the  inch (2 cm) overlapping circles to feel your breast as you move slowly up toward your collarbone.  Continue the up and down exam using all three pressures until you reach your armpit.  Write Down What You Find  Write down what is normal for each breast and any changes that you find. Keep a written record with breast changes or normal findings for each breast. By writing this information down, you do not need to depend only on memory for size, tenderness, or location. Write down where you are in your menstrual cycle, if you are still menstruating. If you are having trouble noticing differences   in your breasts, do not get discouraged. With time you will become more familiar with the variations in your breasts and more comfortable with the exam. How often should I examine my breasts? Examine your breasts every month. If you are breastfeeding, the best time to examine your breasts is after a feeding or after using a breast pump. If you menstruate, the best time to examine your breasts is 5-7 days after your period is over. During your period, your breasts are lumpier, and it may be more difficult to notice changes. When should I see my health care provider? See your health care provider if you notice:  A change in shape or size of your breasts or nipples.  A change in the skin of your breast or nipples, such as a reddened or scaly area.  Unusual discharge from your nipples.  A lump or thick area that was not there before.  Pain in your breasts.  Anything that concerns you.  

## 2018-08-06 NOTE — Addendum Note (Signed)
Addended by: [place] E on: 08/06/2018 05:06 PM   Modules accepted: Orders  

## 2018-08-12 LAB — CYTOLOGY - PAP
Diagnosis: NEGATIVE
HPV (WINDOPATH): NOT DETECTED

## 2019-08-16 NOTE — Progress Notes (Signed)
34 y.o. G7P1001 Married White or Caucasian Not Hispanic or Latino female here for annual exam.  Patient states her c-section scar still hurts.  She is doing well, baby is almost 15 months, nursing. Planning on trying to get pregnant in few months.  Not cycling on the micronor and with nursing.     Patient's last menstrual period was 08/28/2016.          Sexually active: Yes.    The current method of family planning is OCP (estrogen/progesterone).    Exercising: Yes.    cardio, walking, yoga,  Smoker:  no  Health Maintenance: Pap: 2/27/20normal Neg HPV,  07/16/2017 WNL NEG HPV History of abnormal Pap:  Yes H/O LEEP in 2017 TDaP:  07/16/17  Gardasil: no   reports that she has never smoked. She has never used smokeless tobacco. She reports current alcohol use of about 2.0 - 3.0 standard drinks of alcohol per week. She reports that she does not use drugs. She is a Product manager, has her masters. Working for the [employer] She has been working from home. She has a 7 year old daughter  Past Medical History:  Diagnosis Date  . Abnormal Pap smear of cervix   . Anxiety   . Anxiety 05/26/2018  . Asthma   . Asthma, chronic 05/26/2018    Past Surgical History:  Procedure Laterality Date  . CESAREAN SECTION N/A 05/26/2018   Procedure: CESAREAN SECTION;  Surgeon: Azucena Fallen, MD;  Location: [facility];  Service: Obstetrics;  Laterality: N/A;  . LEEP  2017  . MOUTH SURGERY    . WISDOM TOOTH EXTRACTION      Current Outpatient Medications  Medication Sig Dispense Refill  . albuterol (PROVENTIL HFA;VENTOLIN HFA) 108 (90 Base) MCG/ACT inhaler Inhale 2 puffs into the lungs every 6 (six) hours as needed for wheezing or shortness of breath. 1 Inhaler 2  . citalopram (CELEXA) 20 MG tablet Take 1 tablet (20 mg total) by mouth daily. 90 tablet 3  . docusate sodium (COLACE) 100 MG capsule Take 100 mg by mouth 2 (two) times daily.    . norethindrone (MICRONOR,CAMILA,ERRIN) 0.35 MG  tablet Take 1 tablet by mouth daily.    . Prenatal Vit-Fe Fumarate-FA (PRENATAL MULTIVITAMIN) TABS tablet Take 2 tablets by mouth daily at 12 noon.     No current facility-administered medications for this visit.    Family History  Problem Relation Age of Onset  . Stomach cancer Maternal Grandmother   . Stomach cancer Maternal Grandfather   . Stroke Paternal Grandmother   . Heart disease Paternal Grandmother   . Heart attack Paternal Grandmother   . Heart attack Paternal Grandfather     Review of Systems  Constitutional: Positive for fatigue.  HENT: Positive for postnasal drip and sinus pressure.   Eyes: Negative.   Respiratory: Negative.   Cardiovascular: Negative.   Gastrointestinal: Negative.   Endocrine: Negative.   Genitourinary: Negative.   Musculoskeletal: Negative.   Skin: Negative.   Allergic/Immunologic: Negative.   Neurological: Negative.   Hematological: Negative.   Psychiatric/Behavioral: Negative.     Exam:   BP 128/78   Pulse 89   Temp 98.2 F (36.8 C)   Ht 5\' 7"  (1.702 m)   Wt 223 lb (101.2 kg)   LMP 08/28/2016   SpO2 95%   BMI 34.93 kg/m   Weight change: @WEIGHTCHANGE @ Height:   Height: 5\' 7"  (170.2 cm)  Ht Readings from Last 3 Encounters:  08/19/19 5\' 7"  (1.702  m)  08/06/18 5\' 7"  (1.702 m)  05/26/18 5\' 7"  (1.702 m)    General appearance: alert, cooperative and appears stated age Head: Normocephalic, without obvious abnormality, atraumatic Neck: no adenopathy, supple, symmetrical, trachea midline and thyroid normal to inspection and palpation Lungs: clear to auscultation bilaterally Cardiovascular: regular rate and rhythm Breasts: normal appearance, no masses or tenderness Abdomen: soft, non-tender; non distended,  no masses,  no organomegaly Extremities: extremities normal, atraumatic, no cyanosis or edema Skin: Skin color, texture, turgor normal. No rashes or lesions Lymph nodes: Cervical, supraclavicular, and axillary nodes normal. No  abnormal inguinal nodes palpated Neurologic: Grossly normal   Pelvic: External genitalia:  no lesions              Urethra:  normal appearing urethra with no masses, tenderness or lesions              Bartholins and Skenes: normal                 Vagina: normal appearing vagina with normal color and discharge, no lesions              Cervix: no lesions               Bimanual Exam:  Uterus:  normal size, contour, position, consistency, mobility, non-tender and anteverted              Adnexa: no mass, fullness, tenderness               Rectovaginal: Confirms               Anus:  normal sphincter tone, no lesions  05/28/18 chaperoned for the exam.  A:  Well Woman with normal exam  H/O LEEP in 2017, normal paps last few years  Anxiety controlled with Celexa  P:   No Pap this year  She has micronor for the next few months, then plans to try and get pregnant.   On PNV  Continue Celexa.  Discussed gardasil, she will consider in the future  Screening labs  Discussed breast self exam  Discussed calcium and vit D intake

## 2019-08-19 ENCOUNTER — Encounter: Payer: Self-pay | Admitting: Obstetrics and Gynecology

## 2019-08-19 ENCOUNTER — Ambulatory Visit: Payer: 59 | Admitting: Obstetrics and Gynecology

## 2019-08-19 ENCOUNTER — Other Ambulatory Visit: Payer: Self-pay

## 2019-08-19 VITALS — BP 128/78 | HR 89 | Temp 98.2°F | Ht 67.0 in | Wt 223.0 lb

## 2019-08-19 DIAGNOSIS — Z01419 Encounter for gynecological examination (general) (routine) without abnormal findings: Secondary | ICD-10-CM | POA: Diagnosis not present

## 2019-08-19 DIAGNOSIS — Z0000 Encounter for general adult medical examination without abnormal findings: Secondary | ICD-10-CM | POA: Diagnosis not present

## 2019-08-19 MED ORDER — CITALOPRAM HYDROBROMIDE 20 MG PO TABS: 20.0000 mg | ORAL_TABLET | Freq: Every day | ORAL | 3 refills | Status: DC

## 2019-08-19 NOTE — Patient Instructions (Signed)
EXERCISE AND DIET:  We recommended that you start or continue a regular exercise program for good health. Regular exercise means any activity that makes your heart beat faster and makes you sweat.  We recommend exercising at least 30 minutes per day at least 3 days a week, preferably 4 or 5.  We also recommend a diet low in fat and sugar.  Inactivity, poor dietary choices and obesity can cause diabetes, heart attack, stroke, and kidney damage, among others.    ALCOHOL AND SMOKING:  Women should limit their alcohol intake to no more than 7 drinks/beers/glasses of wine (combined, not each!) per week. Moderation of alcohol intake to this level decreases your risk of breast cancer and liver damage. And of course, no recreational drugs are part of a healthy lifestyle.  And absolutely no smoking or even second hand smoke. Most people know smoking can cause heart and lung diseases, but did you know it also contributes to weakening of your bones? Aging of your skin?  Yellowing of your teeth and nails?  CALCIUM AND VITAMIN D:  Adequate intake of calcium and Vitamin D are recommended.  The recommendations for exact amounts of these supplements seem to change often, but generally speaking 1,000 mg of calcium (between diet and supplement) and 800 units of Vitamin D per day seems prudent. Certain women may benefit from higher intake of Vitamin D.  If you are among these women, your doctor will have told you during your visit.    PAP SMEARS:  Pap smears, to check for cervical cancer or precancers,  have traditionally been done yearly, although recent scientific advances have shown that most women can have pap smears less often.  However, every woman still should have a physical exam from her gynecologist every year. It will include a breast check, inspection of the vulva and vagina to check for abnormal growths or skin changes, a visual exam of the cervix, and then an exam to evaluate the size and shape of the uterus and  ovaries.  And after 32 years of age, a rectal exam is indicated to check for rectal cancers. We will also provide age appropriate advice regarding health maintenance, like when you should have certain vaccines, screening for sexually transmitted diseases, bone density testing, colonoscopy, mammograms, etc.   MAMMOGRAMS:  All women over 40 years old should have a yearly mammogram. Many facilities now offer a "3D" mammogram, which may cost around $50 extra out of pocket. If possible,  we recommend you accept the option to have the 3D mammogram performed.  It both reduces the number of women who will be called back for extra views which then turn out to be normal, and it is better than the routine mammogram at detecting truly abnormal areas.    COLON CANCER SCREENING: Now recommend starting at age 45. At this time colonoscopy is not covered for routine screening until 50. There are take home tests that can be done between 45-49.   COLONOSCOPY:  Colonoscopy to screen for colon cancer is recommended for all women at age 50.  We know, you hate the idea of the prep.  We agree, BUT, having colon cancer and not knowing it is worse!!  Colon cancer so often starts as a polyp that can be seen and removed at colonscopy, which can quite literally save your life!  And if your first colonoscopy is normal and you have no family history of colon cancer, most women don't have to have it again for   10 years.  Once every ten years, you can do something that may end up saving your life, right?  We will be happy to help you get it scheduled when you are ready.  Be sure to check your insurance coverage so you understand how much it will cost.  It may be covered as a preventative service at no cost, but you should check your particular policy.      Breast Self-Awareness Breast self-awareness means being familiar with how your breasts look and feel. It involves checking your breasts regularly and reporting any changes to your  health care provider. Practicing breast self-awareness is important. A change in your breasts can be a sign of a serious medical problem. Being familiar with how your breasts look and feel allows you to find any problems early, when treatment is more likely to be successful. All women should practice breast self-awareness, including women who have had breast implants. How to do a breast self-exam One way to learn what is normal for your breasts and whether your breasts are changing is to do a breast self-exam. To do a breast self-exam: Look for Changes  1. Remove all the clothing above your waist. 2. Stand in front of a mirror in a room with good lighting. 3. Put your hands on your hips. 4. Push your hands firmly downward. 5. Compare your breasts in the mirror. Look for differences between them (asymmetry), such as: ? Differences in shape. ? Differences in size. ? Puckers, dips, and bumps in one breast and not the other. 6. Look at each breast for changes in your skin, such as: ? Redness. ? Scaly areas. 7. Look for changes in your nipples, such as: ? Discharge. ? Bleeding. ? Dimpling. ? Redness. ? A change in position. Feel for Changes Carefully feel your breasts for lumps and changes. It is best to do this while lying on your back on the floor and again while sitting or standing in the shower or tub with soapy water on your skin. Feel each breast in the following way:  Place the arm on the side of the breast you are examining above your head.  Feel your breast with the other hand.  Start in the nipple area and make  inch (2 cm) overlapping circles to feel your breast. Use the pads of your three middle fingers to do this. Apply light pressure, then medium pressure, then firm pressure. The light pressure will allow you to feel the tissue closest to the skin. The medium pressure will allow you to feel the tissue that is a little deeper. The firm pressure will allow you to feel the tissue  close to the ribs.  Continue the overlapping circles, moving downward over the breast until you feel your ribs below your breast.  Move one finger-width toward the center of the body. Continue to use the  inch (2 cm) overlapping circles to feel your breast as you move slowly up toward your collarbone.  Continue the up and down exam using all three pressures until you reach your armpit.  Write Down What You Find  Write down what is normal for each breast and any changes that you find. Keep a written record with breast changes or normal findings for each breast. By writing this information down, you do not need to depend only on memory for size, tenderness, or location. Write down where you are in your menstrual cycle, if you are still menstruating. If you are having trouble noticing differences   in your breasts, do not get discouraged. With time you will become more familiar with the variations in your breasts and more comfortable with the exam. How often should I examine my breasts? Examine your breasts every month. If you are breastfeeding, the best time to examine your breasts is after a feeding or after using a breast pump. If you menstruate, the best time to examine your breasts is 5-7 days after your period is over. During your period, your breasts are lumpier, and it may be more difficult to notice changes. When should I see my health care provider? See your health care provider if you notice:  A change in shape or size of your breasts or nipples.  A change in the skin of your breast or nipples, such as a reddened or scaly area.  Unusual discharge from your nipples.  A lump or thick area that was not there before.  Pain in your breasts.  Anything that concerns you.  

## 2019-08-20 LAB — COMPREHENSIVE METABOLIC PANEL
ALT: 15 IU/L (ref 0–32)
AST: 17 IU/L (ref 0–40)
Albumin/Globulin Ratio: 1.9 (ref 1.2–2.2)
Albumin: 4.6 g/dL (ref 3.8–4.8)
Alkaline Phosphatase: 48 IU/L (ref 39–117)
BUN/Creatinine Ratio: 23 (ref 9–23)
BUN: 16 mg/dL (ref 6–20)
Bilirubin Total: 0.5 mg/dL (ref 0.0–1.2)
CO2: 23 mmol/L (ref 20–29)
Calcium: 9.4 mg/dL (ref 8.7–10.2)
Chloride: 103 mmol/L (ref 96–106)
Creatinine, Ser: 0.7 mg/dL (ref 0.57–1.00)
GFR calc Af Amer: 134 mL/min/{1.73_m2} (ref >59)
GFR calc non Af Amer: 116 mL/min/{1.73_m2} (ref >59)
Globulin, Total: 2.4 g/dL (ref 1.5–4.5)
Glucose: 73 mg/dL (ref 65–99)
Potassium: 4.3 mmol/L (ref 3.5–5.2)
Sodium: 143 mmol/L (ref 134–144)
Total Protein: 7 g/dL (ref 6.0–8.5)

## 2019-08-20 LAB — LIPID PANEL
Chol/HDL Ratio: 3 ratio (ref 0.0–4.4)
Cholesterol, Total: 179 mg/dL (ref 100–199)
HDL: 59 mg/dL (ref >39)
LDL Chol Calc (NIH): 105 mg/dL — ABNORMAL HIGH (ref 0–99)
Triglycerides: 82 mg/dL (ref 0–149)
VLDL Cholesterol Cal: 15 mg/dL (ref 5–40)

## 2019-08-20 LAB — CBC
Hematocrit: 43.2 % (ref 34.0–46.6)
Hemoglobin: 13.9 g/dL (ref 11.1–15.9)
MCH: 31.2 pg (ref 26.6–33.0)
MCHC: 32.2 g/dL (ref 31.5–35.7)
MCV: 97 fL (ref 79–97)
Platelets: 223 10*3/uL (ref 150–450)
RBC: 4.45 x10E6/uL (ref 3.77–5.28)
RDW: 13.4 % (ref 11.7–15.4)
WBC: 6.6 10*3/uL (ref 3.4–10.8)

## 2020-06-11 ENCOUNTER — Other Ambulatory Visit: Payer: Self-pay | Admitting: Obstetrics and Gynecology

## 2020-08-30 ENCOUNTER — Other Ambulatory Visit: Payer: Self-pay | Admitting: Obstetrics and Gynecology

## 2020-08-30 ENCOUNTER — Ambulatory Visit: Payer: Self-pay | Admitting: Obstetrics and Gynecology

## 2020-08-31 NOTE — Telephone Encounter (Signed)
Left message for patient to call and schedule AEX since past due and then we can check on refill with Dr. Oscar La.

## 2020-08-31 NOTE — Telephone Encounter (Signed)
Past due for annual exam and not yet scheduled. Last AEX 08/19/19. I will call her.

## 2021-01-11 ENCOUNTER — Inpatient Hospital Stay (HOSPITAL_COMMUNITY)
Admission: AD | Admit: 2021-01-11 | Discharge: 2021-01-14 | DRG: 786 | Disposition: A | Discharge status: Home / Self Care | Payer: 59 | Attending: Obstetrics | Admitting: Obstetrics

## 2021-01-11 ENCOUNTER — Encounter (HOSPITAL_COMMUNITY): Payer: Self-pay | Admitting: *Deleted

## 2021-01-11 DIAGNOSIS — F419 Anxiety disorder, unspecified: Secondary | ICD-10-CM | POA: Diagnosis present

## 2021-01-11 DIAGNOSIS — O99824 Streptococcus B carrier state complicating childbirth: Secondary | ICD-10-CM | POA: Diagnosis present

## 2021-01-11 DIAGNOSIS — O9852 Other viral diseases complicating childbirth: Secondary | ICD-10-CM | POA: Diagnosis present

## 2021-01-11 DIAGNOSIS — O34211 Maternal care for low transverse scar from previous cesarean delivery: Secondary | ICD-10-CM | POA: Diagnosis present

## 2021-01-11 DIAGNOSIS — O99344 Other mental disorders complicating childbirth: Secondary | ICD-10-CM | POA: Diagnosis present

## 2021-01-11 DIAGNOSIS — Z98891 History of uterine scar from previous surgery: Secondary | ICD-10-CM

## 2021-01-11 DIAGNOSIS — O98513 Other viral diseases complicating pregnancy, third trimester: Secondary | ICD-10-CM | POA: Diagnosis not present

## 2021-01-11 DIAGNOSIS — O134 Gestational [pregnancy-induced] hypertension without significant proteinuria, complicating childbirth: Secondary | ICD-10-CM | POA: Diagnosis not present

## 2021-01-11 DIAGNOSIS — O133 Gestational [pregnancy-induced] hypertension without significant proteinuria, third trimester: Secondary | ICD-10-CM

## 2021-01-11 DIAGNOSIS — U071 COVID-19: Secondary | ICD-10-CM | POA: Diagnosis present

## 2021-01-11 DIAGNOSIS — Z3A39 39 weeks gestation of pregnancy: Secondary | ICD-10-CM

## 2021-01-11 DIAGNOSIS — O139 Gestational [pregnancy-induced] hypertension without significant proteinuria, unspecified trimester: Secondary | ICD-10-CM | POA: Diagnosis present

## 2021-01-11 LAB — URINALYSIS, ROUTINE W REFLEX MICROSCOPIC
Bilirubin Urine: NEGATIVE
Glucose, UA: NEGATIVE mg/dL
Hgb urine dipstick: NEGATIVE
Ketones, ur: NEGATIVE mg/dL
Leukocytes,Ua: NEGATIVE
Nitrite: NEGATIVE
Protein, ur: NEGATIVE mg/dL
Specific Gravity, Urine: 1.004 — ABNORMAL LOW (ref 1.005–1.030)
pH: 7 (ref 5.0–8.0)

## 2021-01-11 LAB — PROTEIN / CREATININE RATIO, URINE
Creatinine, Urine: 24.67 mg/dL
Total Protein, Urine: <6 mg/dL

## 2021-01-11 LAB — CBC
HCT: 36.7 % (ref 36.0–46.0)
Hemoglobin: 12.2 g/dL (ref 12.0–15.0)
MCH: 30.5 pg (ref 26.0–34.0)
MCHC: 33.2 g/dL (ref 30.0–36.0)
MCV: 91.8 fL (ref 80.0–100.0)
Platelets: 170 10*3/uL (ref 150–400)
RBC: 4 MIL/uL (ref 3.87–5.11)
RDW: 13.9 % (ref 11.5–15.5)
WBC: 11.7 10*3/uL — ABNORMAL HIGH (ref 4.0–10.5)
nRBC: 0 % (ref 0.0–0.2)

## 2021-01-11 LAB — COMPREHENSIVE METABOLIC PANEL
ALT: 21 U/L (ref 0–44)
AST: 23 U/L (ref 15–41)
Albumin: 2.8 g/dL — ABNORMAL LOW (ref 3.5–5.0)
Alkaline Phosphatase: 90 U/L (ref 38–126)
Anion gap: 9 (ref 5–15)
BUN: 7 mg/dL (ref 6–20)
CO2: 20 mmol/L — ABNORMAL LOW (ref 22–32)
Calcium: 8.9 mg/dL (ref 8.9–10.3)
Chloride: 108 mmol/L (ref 98–111)
Creatinine, Ser: 0.61 mg/dL (ref 0.44–1.00)
GFR, Estimated: >60 mL/min (ref >60)
Glucose, Bld: 98 mg/dL (ref 70–99)
Potassium: 4.1 mmol/L (ref 3.5–5.1)
Sodium: 137 mmol/L (ref 135–145)
Total Bilirubin: 0.6 mg/dL (ref 0.3–1.2)
Total Protein: 5.9 g/dL — ABNORMAL LOW (ref 6.5–8.1)

## 2021-01-11 LAB — TYPE AND SCREEN
ABO/RH(D): AB POS
Antibody Screen: NEGATIVE

## 2021-01-11 MED ORDER — MAGNESIUM SULFATE BOLUS VIA INFUSION
4.0000 g | Freq: Once | INTRAVENOUS | Status: DC
  Filled 2021-01-11: qty 1000

## 2021-01-11 MED ORDER — PRENATAL MULTIVITAMIN CH: 1.0000 | ORAL_TABLET | Freq: Every day | ORAL | Status: DC

## 2021-01-11 MED ORDER — CALCIUM CARBONATE ANTACID 500 MG PO CHEW
2.0000 | CHEWABLE_TABLET | ORAL | Status: DC | PRN
  Administered 2021-01-12: 400 mg via ORAL
  Filled 2021-01-11: qty 2

## 2021-01-11 MED ORDER — FAMOTIDINE IN NACL 20-0.9 MG/50ML-% IV SOLN
20.0000 mg | Freq: Once | INTRAVENOUS | Status: AC
  Administered 2021-01-11: 20 mg via INTRAVENOUS
  Filled 2021-01-11: qty 50

## 2021-01-11 MED ORDER — LABETALOL HCL 5 MG/ML IV SOLN: 40.0000 mg | INTRAVENOUS | Status: DC | PRN

## 2021-01-11 MED ORDER — DOCUSATE SODIUM 100 MG PO CAPS: 100.0000 mg | ORAL_CAPSULE | Freq: Every day | ORAL | Status: DC

## 2021-01-11 MED ORDER — SODIUM CHLORIDE 0.9 % IV SOLN: INTRAVENOUS | Status: DC

## 2021-01-11 MED ORDER — LABETALOL HCL 5 MG/ML IV SOLN: 80.0000 mg | INTRAVENOUS | Status: DC | PRN

## 2021-01-11 MED ORDER — HYDRALAZINE HCL 20 MG/ML IJ SOLN: 10.0000 mg | INTRAMUSCULAR | Status: DC | PRN

## 2021-01-11 MED ORDER — GUAIFENESIN 100 MG/5ML PO SOLN
5.0000 mL | Freq: Once | ORAL | Status: AC
  Administered 2021-01-11: 100 mg via ORAL
  Filled 2021-01-11: qty 15

## 2021-01-11 MED ORDER — LABETALOL HCL 5 MG/ML IV SOLN
20.0000 mg | INTRAVENOUS | Status: DC | PRN
  Administered 2021-01-11: 20 mg via INTRAVENOUS
  Filled 2021-01-11: qty 4

## 2021-01-11 MED ORDER — MAGNESIUM SULFATE 40 GM/1000ML IV SOLN: 2.0000 g/h | INTRAVENOUS | Status: DC

## 2021-01-11 MED ORDER — ACETAMINOPHEN 325 MG PO TABS
650.0000 mg | ORAL_TABLET | ORAL | Status: DC | PRN
  Filled 2021-01-11: qty 2

## 2021-01-11 MED ORDER — ZOLPIDEM TARTRATE 5 MG PO TABS
5.0000 mg | ORAL_TABLET | Freq: Every evening | ORAL | Status: DC | PRN
  Administered 2021-01-11: 5 mg via ORAL
  Filled 2021-01-11: qty 1

## 2021-01-11 NOTE — MAU Provider Note (Signed)
History     [id number]  Arrival date and time: 01/11/21 1550    Chief Complaint  Patient presents with   Hypertension     HPI Brandi Davies is a 35 y.o. at [redacted]w[redacted]d by patient report with PMHx notable for pLTCS for AoDescent (infant ROP, extended neck, macrosomic), who presents for elevated BP's at home in setting of recent diagnosis of COVID.   Review of outside prenatal records from [facility] (in media tab): unremarkable prenatal course, hoping to [facility]  Patient reports she had mild range BP's at home Denies headache, vision changes, chest pain, shortness of breath, RUQ pain, LE edema Does endorse symptoms from COVID, has painful cough and runny nose Normal fetal movement No loss of fluid No contraction No bleeding  --/--/AB POS (08/04 1640)  OB History     Gravida  2   Para  1   Term  1   Preterm  0   AB  0   Living  1      SAB  0   IAB  0   Ectopic  0   Multiple  0   Live Births  1           Past Medical History:  Diagnosis Date   Abnormal Pap smear of cervix    Anxiety    Anxiety 05/26/2018   Asthma    Asthma, chronic 05/26/2018    Past Surgical History:  Procedure Laterality Date   CESAREAN SECTION N/A 05/26/2018   Procedure: CESAREAN SECTION;  Surgeon: Shea Evans, MD;  Location: [facility] BIRTHING SUITES;  Service: Obstetrics;  Laterality: N/A;   LEEP  2017   MOUTH SURGERY     WISDOM TOOTH EXTRACTION      Family History  Problem Relation Age of Onset   Stomach cancer Maternal Grandmother    Stomach cancer Maternal Grandfather    Stroke Paternal Grandmother    Heart disease Paternal Grandmother    Heart attack Paternal Grandmother    Heart attack Paternal Grandfather     Social History   Socioeconomic History   Marital status: Married    Spouse name: Not on file   Number of children: Not on file   Years of education: Not on file   Highest education level: Not on file  Occupational History   Not on file  Tobacco  Use   Smoking status: Never   Smokeless tobacco: Never  Vaping Use   Vaping Use: Never used  Substance and Sexual Activity   Alcohol use: Yes    Alcohol/week: 2.0 - 3.0 standard drinks    Types: 2 - 3 Standard drinks or equivalent per week   Drug use: No   Sexual activity: Yes    Partners: Male    Birth control/protection: Pill  Other Topics Concern   Not on file  Social History Narrative   Not on file   Social Determinants of Health   Financial Resource Strain: Not on file  Food Insecurity: Not on file  Transportation Needs: Not on file  Physical Activity: Not on file  Stress: Not on file  Social Connections: Not on file  Intimate Partner Violence: Not on file    Allergies  Allergen Reactions   Sulfa Antibiotics Swelling    No current facility-administered medications on file prior to encounter.   Current Outpatient Medications on File Prior to Encounter  Medication Sig Dispense Refill   citalopram (CELEXA) 20 MG tablet TAKE 1 TABLET BY MOUTH  DAILY  90 tablet 0   docusate sodium (COLACE) 100 MG capsule Take 100 mg by mouth 2 (two) times daily.     Prenatal Vit-Fe Fumarate-FA (PRENATAL MULTIVITAMIN) TABS tablet Take 2 tablets by mouth daily at 12 noon.     albuterol (PROVENTIL HFA;VENTOLIN HFA) 108 (90 Base) MCG/ACT inhaler Inhale 2 puffs into the lungs every 6 (six) hours as needed for wheezing or shortness of breath. 1 Inhaler 2   norethindrone (MICRONOR,CAMILA,ERRIN) 0.35 MG tablet Take 1 tablet by mouth daily. (Patient not taking: Reported on 01/11/2021)       ROS Pertinent positives and negative per HPI, all others reviewed and negative  Physical Exam   BP (!) 160/103   Pulse 92   Temp 98.7 F (37.1 C)   Resp 18   SpO2 98%   Patient Vitals for the past 24 hrs:  BP Temp Pulse Resp SpO2  01/11/21 1801 (!) 160/103 -- 92 -- 98 %  01/11/21 1746 (!) 154/90 -- 86 -- 98 %  01/11/21 1731 (!) 157/95 -- 96 -- 99 %  01/11/21 1716 (!) 139/95 -- 96 -- 98 %   01/11/21 1701 (!) 145/90 -- 95 -- 98 %  01/11/21 1646 (!) 144/92 -- 88 -- 98 %  01/11/21 1631 (!) 152/98 -- 96 -- 97 %  01/11/21 1620 (!) 156/95 -- 97 -- --  01/11/21 1610 (!) 142/94 98.7 F (37.1 C) 92 18 97 %    Physical Exam Vitals reviewed.  Constitutional:      General: She is not in acute distress.    Appearance: She is well-developed. She is not diaphoretic.  Eyes:     General: No scleral icterus. Pulmonary:     Effort: Pulmonary effort is normal. No respiratory distress.  Abdominal:     General: There is no distension.     Palpations: Abdomen is soft.     Tenderness: There is no abdominal tenderness. There is no guarding or rebound.  Skin:    General: Skin is warm and dry.  Neurological:     Mental Status: She is alert.     Coordination: Coordination normal.     Cervical Exam    Bedside Ultrasound Not done  My interpretation: n/a  FHT Baseline 145, moderate variability, +accels, no decels Toco: rare ctx Cat: I  Labs Results for orders placed or performed during the hospital encounter of 01/11/21 (from the past 24 hour(s))  Protein / creatinine ratio, urine     Status: None   Collection Time: 01/11/21  4:13 PM  Result Value Ref Range   Creatinine, Urine 24.67 mg/dL   Total Protein, Urine <6 mg/dL   Protein Creatinine Ratio        0.00 - 0.15 mg/mg[Cre]  Urinalysis, Routine w reflex microscopic     Status: Abnormal   Collection Time: 01/11/21  4:13 PM  Result Value Ref Range   Color, Urine YELLOW YELLOW   APPearance HAZY (A) CLEAR   Specific Gravity, Urine 1.004 (L) 1.005 - 1.030   pH 7.0 5.0 - 8.0   Glucose, UA NEGATIVE NEGATIVE mg/dL   Hgb urine dipstick NEGATIVE NEGATIVE   Bilirubin Urine NEGATIVE NEGATIVE   Ketones, ur NEGATIVE NEGATIVE mg/dL   Protein, ur NEGATIVE NEGATIVE mg/dL   Nitrite NEGATIVE NEGATIVE   Leukocytes,Ua NEGATIVE NEGATIVE  Type and screen [facility]     Status: None   Collection Time: 01/11/21  4:40 PM   Result Value Ref Range   ABO/RH(D) AB POS  Antibody Screen NEG    Sample Expiration      01/14/2021,2359 Performed at [facility], [address], [place], [place] [address]   CBC     Status: Abnormal   Collection Time: 01/11/21  4:46 PM  Result Value Ref Range   WBC 11.7 (H) 4.0 - 10.5 K/uL   RBC 4.00 3.87 - 5.11 MIL/uL   Hemoglobin 12.2 12.0 - 15.0 g/dL   HCT 19.1 47.8 - 29.5 %   MCV 91.8 80.0 - 100.0 fL   MCH 30.5 26.0 - 34.0 pg   MCHC 33.2 30.0 - 36.0 g/dL   RDW 62.1 30.8 - 65.7 %   Platelets 170 150 - 400 K/uL   nRBC 0.0 0.0 - 0.2 %  Comprehensive metabolic panel     Status: Abnormal   Collection Time: 01/11/21  4:46 PM  Result Value Ref Range   Sodium 137 135 - 145 mmol/L   Potassium 4.1 3.5 - 5.1 mmol/L   Chloride 108 98 - 111 mmol/L   CO2 20 (L) 22 - 32 mmol/L   Glucose, Bld 98 70 - 99 mg/dL   BUN 7 6 - 20 mg/dL   Creatinine, Ser 8.46 0.44 - 1.00 mg/dL   Calcium 8.9 8.9 - 96.2 mg/dL   Total Protein 5.9 (L) 6.5 - 8.1 g/dL   Albumin 2.8 (L) 3.5 - 5.0 g/dL   AST 23 15 - 41 U/L   ALT 21 0 - 44 U/L   Alkaline Phosphatase 90 38 - 126 U/L   Total Bilirubin 0.6 0.3 - 1.2 mg/dL   GFR, Estimated >95 >28 mL/min   Anion gap 9 5 - 15    Imaging No results found.  MAU Course  Procedures Lab Orders  Protein / creatinine ratio, urine  Urinalysis, Routine w reflex microscopic Urine, Clean Catch  CBC  Comprehensive metabolic panel  RPR  Meds ordered this encounter  Medications   famotidine (PEPCID) IVPB 20 mg premix   guaiFENesin (ROBITUSSIN) 100 MG/5ML solution 100 mg   Imaging Orders  No imaging studies ordered today    MDM moderate  Assessment and Plan  #Gestational HTN Patient with persistent mild range BP's since arrival. Labs unremarkable. NST reactive. Given gHTN at term recommend admission for delivery. During our conversation patient expressed a strong desire to [facility] and she appears to be a good candidate based on her history. Discussed  with Dr. Algie Coffer, she will assume care of the patient and come to discuss plan of care further.  #FWB FHT Cat I NST: Reactive   Care transferred to Dr. Algie Coffer.   Venora Maples, MD/MPH 01/11/21 6:33 PM

## 2021-01-11 NOTE — MAU Note (Signed)
Pt sent in to evaluate  elevated b/p at home. Pt stated she had elevated b/p at home 156/97,- 142-97. Denise any headache or visual change today. Had a headache yesterday with fever. Taking tylenol and keeping temp down and heache is gone Stated having covid symptom yesterday. (Daughter tested positive on TUesday.) good fetal movement felt

## 2021-01-11 NOTE — Plan of Care (Signed)
  Problem: Education: Goal: Knowledge of disease or condition will improve Outcome: Progressing Goal: Knowledge of the prescribed therapeutic regimen will improve Outcome: Progressing Goal: Individualized Educational Video(s) Outcome: Progressing   Problem: Education: Goal: Knowledge of the prescribed therapeutic regimen will improve Outcome: Progressing

## 2021-01-11 NOTE — H&P (Signed)
Brandi Davies is a 35 y.o. G2P1001 at [redacted]w[redacted]d presenting for hypertension. Pt notes now contractions. Good fetal movement, No vaginal bleeding, not leaking fluid.  [facility] first [facility] -History of primary cesarean section, baby 9 4.  Set to 10 cm in active labor but no descent.  Baby remained high.  Initially planning repeat C-section changed mind and desired [place] -Gestational hypertension just diagnosed yesterday.  Not on meds other than baby aspirin given history of gestational pretension in her prior pregnancy.   Prenatal Transfer Tool  Maternal Diabetes: No Genetic Screening: Normal Maternal Ultrasounds/Referrals: Normal Fetal Ultrasounds or other Referrals:  None Maternal Substance Abuse:  No Significant Maternal Medications:  None Significant Maternal Lab Results: Group B Strep positive     OB History     Gravida  2   Para  1   Term  1   Preterm  0   AB  0   Living  1      SAB  0   IAB  0   Ectopic  0   Multiple  0   Live Births  1          Past Medical History:  Diagnosis Date   Abnormal Pap smear of cervix    Anxiety    Anxiety 05/26/2018   Asthma    Asthma, chronic 05/26/2018   Past Surgical History:  Procedure Laterality Date   CESAREAN SECTION N/A 05/26/2018   Procedure: CESAREAN SECTION;  Surgeon: Shea Evans, MD;  Location: [facility] BIRTHING SUITES;  Service: Obstetrics;  Laterality: N/A;   LEEP  2017   MOUTH SURGERY     WISDOM TOOTH EXTRACTION     Family History: family history includes Heart attack in her paternal grandfather and paternal grandmother; Heart disease in her paternal grandmother; Stomach cancer in her maternal grandfather and maternal grandmother; Stroke in her paternal grandmother. Social History:  reports that she has never smoked. She has never used smokeless tobacco. She reports current alcohol use of about 2.0 - 3.0 standard drinks of alcohol per week. She reports that she does not use  drugs.  Review of Systems - Negative except cough, headache, sinus pressure     Blood pressure (!) 149/97, pulse 90, temperature 98.7 F (37.1 C), resp. rate 18, SpO2 98 %, currently breastfeeding.  Physical Exam:  Vitals:   01/11/21 1931 01/11/21 1947 01/11/21 2001 01/11/21 2023  BP: (!) 155/92 (!) 153/90 (!) 155/95 (!) 149/97  Pulse: 84 91 92 90  Resp:      Temp:      SpO2:        Gen: well appearing, no distress, coughing intermittently CV: RRR Pulm: CTAB Back: no CVAT Abd: gravid, NT, no RUQ pain LE: Trace edema, equal bilaterally, non-tender Toco: None FH: baseline 120s, accelerations present, no deceleratons, 10 beat variability  Prenatal labs: ABO, Rh: --/--/AB POS (08/04 1640) Antibody: NEG (08/04 1640) Rubella: Immune RPR:   Nonreactive HBsAg:   Negative  HIV:   Negative GBS:   Positive 1 hr Glucola 105  Genetic screening normal quad Anatomy US normal  CBC    Component Value Date/Time   WBC 11.7 (H) 01/11/2021 1646   RBC 4.00 01/11/2021 1646   HGB 12.2 01/11/2021 1646   HGB 13.9 08/19/2019 0918   HCT 36.7 01/11/2021 1646   HCT 43.2 08/19/2019 0918   PLT 170 01/11/2021 1646   PLT 223 08/19/2019 0918   MCV 91.8 01/11/2021 1646   MCV 97  08/19/2019 0918   MCH 30.5 01/11/2021 1646   MCHC 33.2 01/11/2021 1646   RDW 13.9 01/11/2021 1646   RDW 13.4 08/19/2019 0918    .cmpre  Assessment/Plan: 35 y.o. G2P1001 at [redacted]w[redacted]d Gestational hypertension without evidence of preeclampsia.  Patient has had 1 severe range blood pressure in MAU requiring IV labetalol.  Blood pressures remain elevated but not in the severe range.  Given elevated blood pressures recommend moved towards delivery.  Discussed with patient option for induction with Pitocin and cervical Foley bulb however given fetal station remains high and prior C-section this has risks of failure and prolonged induction.  Patient now considering a repeat cesarean section.  Risk benefits of C-section discussed  with patient -COVID-positive.  Patient symptomatic with cough and would like to remain on Delsym.  We are addressing now support person options with her husband also has COVID.  They are working out childcare arrangements given the live in grandmother is COVID with symptoms making her unable to care for their daughter.  Lendon Colonel 01/11/2021, 8:43 PM

## 2021-01-12 ENCOUNTER — Other Ambulatory Visit: Payer: Self-pay

## 2021-01-12 ENCOUNTER — Inpatient Hospital Stay (HOSPITAL_COMMUNITY): Payer: 59 | Admitting: Anesthesiology

## 2021-01-12 ENCOUNTER — Encounter (HOSPITAL_COMMUNITY)
Admission: AD | Disposition: A | Discharge status: Home / Self Care | Payer: Self-pay | Source: Home / Self Care | Attending: Obstetrics

## 2021-01-12 ENCOUNTER — Encounter (HOSPITAL_COMMUNITY): Payer: Self-pay | Admitting: Obstetrics

## 2021-01-12 DIAGNOSIS — Z98891 History of uterine scar from previous surgery: Secondary | ICD-10-CM

## 2021-01-12 DIAGNOSIS — U071 COVID-19: Secondary | ICD-10-CM | POA: Diagnosis present

## 2021-01-12 LAB — CBC WITH DIFFERENTIAL/PLATELET
Abs Immature Granulocytes: 0.08 10*3/uL — ABNORMAL HIGH (ref 0.00–0.07)
Basophils Absolute: 0 10*3/uL (ref 0.0–0.1)
Basophils Relative: 0 %
Eosinophils Absolute: 0.1 10*3/uL (ref 0.0–0.5)
Eosinophils Relative: 1 %
HCT: 35.9 % — ABNORMAL LOW (ref 36.0–46.0)
Hemoglobin: 12 g/dL (ref 12.0–15.0)
Immature Granulocytes: 1 %
Lymphocytes Relative: 11 %
Lymphs Abs: 1.5 10*3/uL (ref 0.7–4.0)
MCH: 30.8 pg (ref 26.0–34.0)
MCHC: 33.4 g/dL (ref 30.0–36.0)
MCV: 92.3 fL (ref 80.0–100.0)
Monocytes Absolute: 1.1 10*3/uL — ABNORMAL HIGH (ref 0.1–1.0)
Monocytes Relative: 8 %
Neutro Abs: 11.4 10*3/uL — ABNORMAL HIGH (ref 1.7–7.7)
Neutrophils Relative %: 79 %
Platelets: 143 10*3/uL — ABNORMAL LOW (ref 150–400)
RBC: 3.89 MIL/uL (ref 3.87–5.11)
RDW: 14.2 % (ref 11.5–15.5)
WBC: 14.2 10*3/uL — ABNORMAL HIGH (ref 4.0–10.5)
nRBC: 0 % (ref 0.0–0.2)

## 2021-01-12 LAB — COMPREHENSIVE METABOLIC PANEL
ALT: 22 U/L (ref 0–44)
AST: 19 U/L (ref 15–41)
Albumin: 2.7 g/dL — ABNORMAL LOW (ref 3.5–5.0)
Alkaline Phosphatase: 84 U/L (ref 38–126)
Anion gap: 8 (ref 5–15)
BUN: 9 mg/dL (ref 6–20)
CO2: 20 mmol/L — ABNORMAL LOW (ref 22–32)
Calcium: 8.5 mg/dL — ABNORMAL LOW (ref 8.9–10.3)
Chloride: 107 mmol/L (ref 98–111)
Creatinine, Ser: 0.65 mg/dL (ref 0.44–1.00)
GFR, Estimated: >60 mL/min (ref >60)
Glucose, Bld: 77 mg/dL (ref 70–99)
Potassium: 4.2 mmol/L (ref 3.5–5.1)
Sodium: 135 mmol/L (ref 135–145)
Total Bilirubin: 0.5 mg/dL (ref 0.3–1.2)
Total Protein: 5.8 g/dL — ABNORMAL LOW (ref 6.5–8.1)

## 2021-01-12 LAB — RPR: RPR Ser Ql: NONREACTIVE

## 2021-01-12 SURGERY — Surgical Case
Anesthesia: Spinal

## 2021-01-12 MED ORDER — EPHEDRINE 5 MG/ML INJ
INTRAVENOUS | Status: AC
  Filled 2021-01-12: qty 5

## 2021-01-12 MED ORDER — SIMETHICONE 80 MG PO CHEW
80.0000 mg | CHEWABLE_TABLET | Freq: Three times a day (TID) | ORAL | Status: DC
  Administered 2021-01-12 – 2021-01-14 (×5): 80 mg via ORAL
  Filled 2021-01-12 (×5): qty 1

## 2021-01-12 MED ORDER — OXYTOCIN-SODIUM CHLORIDE 30-0.9 UT/500ML-% IV SOLN
INTRAVENOUS | Status: AC
  Filled 2021-01-12: qty 500

## 2021-01-12 MED ORDER — CEFAZOLIN SODIUM-DEXTROSE 2-3 GM-%(50ML) IV SOLR
INTRAVENOUS | Status: DC | PRN
  Administered 2021-01-12: 2 g via INTRAVENOUS

## 2021-01-12 MED ORDER — CITALOPRAM HYDROBROMIDE 20 MG PO TABS
20.0000 mg | ORAL_TABLET | Freq: Every day | ORAL | Status: DC
  Administered 2021-01-12 – 2021-01-14 (×3): 20 mg via ORAL
  Filled 2021-01-12 (×3): qty 1

## 2021-01-12 MED ORDER — IBUPROFEN 600 MG PO TABS
600.0000 mg | ORAL_TABLET | Freq: Four times a day (QID) | ORAL | Status: DC
  Administered 2021-01-13 – 2021-01-14 (×5): 600 mg via ORAL
  Filled 2021-01-12 (×5): qty 1

## 2021-01-12 MED ORDER — PROMETHAZINE HCL 25 MG/ML IJ SOLN: 6.2500 mg | INTRAMUSCULAR | Status: DC | PRN

## 2021-01-12 MED ORDER — NALOXONE HCL 4 MG/10ML IJ SOLN
1.0000 ug/kg/h | INTRAVENOUS | Status: DC | PRN
  Filled 2021-01-12: qty 5

## 2021-01-12 MED ORDER — KETOROLAC TROMETHAMINE 30 MG/ML IJ SOLN
30.0000 mg | Freq: Once | INTRAMUSCULAR | Status: AC | PRN
Start: 2021-01-12 — End: 2021-01-12
  Administered 2021-01-12: 30 mg via INTRAVENOUS

## 2021-01-12 MED ORDER — SODIUM CHLORIDE 0.9% FLUSH: 3.0000 mL | INTRAVENOUS | Status: DC | PRN

## 2021-01-12 MED ORDER — SODIUM CHLORIDE 0.9 % IV SOLN
12.5000 mg | Freq: Once | INTRAVENOUS | Status: AC
  Administered 2021-01-12: 12.5 mg via INTRAVENOUS
  Filled 2021-01-12 (×2): qty 0.5

## 2021-01-12 MED ORDER — ONDANSETRON HCL 4 MG/2ML IJ SOLN
INTRAMUSCULAR | Status: DC | PRN
  Administered 2021-01-12: 4 mg via INTRAVENOUS

## 2021-01-12 MED ORDER — FENTANYL CITRATE (PF) 100 MCG/2ML IJ SOLN
INTRAMUSCULAR | Status: AC
  Filled 2021-01-12: qty 2

## 2021-01-12 MED ORDER — DIPHENHYDRAMINE HCL 50 MG/ML IJ SOLN: 12.5000 mg | INTRAMUSCULAR | Status: DC | PRN

## 2021-01-12 MED ORDER — DIPHENHYDRAMINE HCL 25 MG PO CAPS: 25.0000 mg | ORAL_CAPSULE | Freq: Four times a day (QID) | ORAL | Status: DC | PRN

## 2021-01-12 MED ORDER — NALBUPHINE HCL 10 MG/ML IJ SOLN: 5.0000 mg | INTRAMUSCULAR | Status: DC | PRN

## 2021-01-12 MED ORDER — SOD CITRATE-CITRIC ACID 500-334 MG/5ML PO SOLN
ORAL | Status: AC
  Administered 2021-01-12: 30 mL
  Filled 2021-01-12: qty 30

## 2021-01-12 MED ORDER — OXYCODONE HCL 5 MG PO TABS: 5.0000 mg | ORAL_TABLET | Freq: Once | ORAL | Status: DC | PRN

## 2021-01-12 MED ORDER — SENNOSIDES-DOCUSATE SODIUM 8.6-50 MG PO TABS
2.0000 | ORAL_TABLET | ORAL | Status: DC
  Administered 2021-01-13 – 2021-01-14 (×2): 2 via ORAL
  Filled 2021-01-12 (×2): qty 2

## 2021-01-12 MED ORDER — OXYCODONE HCL 5 MG/5ML PO SOLN: 5.0000 mg | Freq: Once | ORAL | Status: DC | PRN

## 2021-01-12 MED ORDER — ACETAMINOPHEN 500 MG PO TABS
1000.0000 mg | ORAL_TABLET | Freq: Four times a day (QID) | ORAL | Status: DC
  Administered 2021-01-12 – 2021-01-14 (×7): 1000 mg via ORAL
  Filled 2021-01-12 (×7): qty 2

## 2021-01-12 MED ORDER — MEPERIDINE HCL 25 MG/ML IJ SOLN: 6.2500 mg | INTRAMUSCULAR | Status: DC | PRN

## 2021-01-12 MED ORDER — OXYCODONE HCL 5 MG PO TABS: 5.0000 mg | ORAL_TABLET | ORAL | Status: DC | PRN

## 2021-01-12 MED ORDER — ZOLPIDEM TARTRATE 5 MG PO TABS: 5.0000 mg | ORAL_TABLET | Freq: Every evening | ORAL | Status: DC | PRN

## 2021-01-12 MED ORDER — ENOXAPARIN SODIUM 60 MG/0.6ML IJ SOSY: 60.0000 mg | PREFILLED_SYRINGE | INTRAMUSCULAR | Status: DC

## 2021-01-12 MED ORDER — LACTATED RINGERS IV SOLN: INTRAVENOUS | Status: DC

## 2021-01-12 MED ORDER — ONDANSETRON HCL 4 MG/2ML IJ SOLN
INTRAMUSCULAR | Status: AC
  Filled 2021-01-12: qty 2

## 2021-01-12 MED ORDER — MENTHOL 3 MG MT LOZG: 1.0000 | LOZENGE | OROMUCOSAL | Status: DC | PRN

## 2021-01-12 MED ORDER — NALBUPHINE HCL 10 MG/ML IJ SOLN
5.0000 mg | Freq: Once | INTRAMUSCULAR | Status: DC | PRN
Start: 2021-01-12 — End: 2021-01-14

## 2021-01-12 MED ORDER — DIBUCAINE (PERIANAL) 1 % EX OINT: 1.0000 "application " | TOPICAL_OINTMENT | CUTANEOUS | Status: DC | PRN

## 2021-01-12 MED ORDER — OXYTOCIN-SODIUM CHLORIDE 30-0.9 UT/500ML-% IV SOLN: 2.5000 [IU]/h | INTRAVENOUS | Status: AC

## 2021-01-12 MED ORDER — CEFAZOLIN SODIUM-DEXTROSE 2-4 GM/100ML-% IV SOLN
INTRAVENOUS | Status: AC
  Filled 2021-01-12: qty 100

## 2021-01-12 MED ORDER — HYDROMORPHONE HCL 1 MG/ML IJ SOLN: 0.2500 mg | INTRAMUSCULAR | Status: DC | PRN

## 2021-01-12 MED ORDER — NORETHINDRONE 0.35 MG PO TABS
1.0000 | ORAL_TABLET | Freq: Every day | ORAL | Status: DC
Start: 2021-01-12 — End: 2021-01-12

## 2021-01-12 MED ORDER — ONDANSETRON HCL 4 MG/2ML IJ SOLN
4.0000 mg | INTRAMUSCULAR | Status: DC | PRN
  Administered 2021-01-12: 4 mg via INTRAVENOUS
  Filled 2021-01-12: qty 2

## 2021-01-12 MED ORDER — OXYTOCIN-SODIUM CHLORIDE 30-0.9 UT/500ML-% IV SOLN
INTRAVENOUS | Status: DC | PRN
  Administered 2021-01-12: 300 mL via INTRAVENOUS

## 2021-01-12 MED ORDER — NALBUPHINE HCL 10 MG/ML IJ SOLN: 5.0000 mg | Freq: Once | INTRAMUSCULAR | Status: DC | PRN

## 2021-01-12 MED ORDER — SIMETHICONE 80 MG PO CHEW: 80.0000 mg | CHEWABLE_TABLET | ORAL | Status: DC | PRN

## 2021-01-12 MED ORDER — PHENYLEPHRINE HCL-NACL 20-0.9 MG/250ML-% IV SOLN
INTRAVENOUS | Status: AC
  Filled 2021-01-12: qty 250

## 2021-01-12 MED ORDER — KETOROLAC TROMETHAMINE 30 MG/ML IJ SOLN
INTRAMUSCULAR | Status: AC
  Filled 2021-01-12: qty 1

## 2021-01-12 MED ORDER — PRENATAL MULTIVITAMIN CH
1.0000 | ORAL_TABLET | Freq: Every day | ORAL | Status: DC
  Administered 2021-01-13 – 2021-01-14 (×2): 1 via ORAL
  Filled 2021-01-12 (×2): qty 1

## 2021-01-12 MED ORDER — TETANUS-DIPHTH-ACELL PERTUSSIS 5-2.5-18.5 LF-MCG/0.5 IM SUSY: 0.5000 mL | PREFILLED_SYRINGE | Freq: Once | INTRAMUSCULAR | Status: DC

## 2021-01-12 MED ORDER — DIPHENHYDRAMINE HCL 25 MG PO CAPS: 25.0000 mg | ORAL_CAPSULE | ORAL | Status: DC | PRN

## 2021-01-12 MED ORDER — BUPIVACAINE HCL (PF) 0.5 % IJ SOLN
INTRAMUSCULAR | Status: DC | PRN
  Administered 2021-01-12: 20 mL

## 2021-01-12 MED ORDER — COCONUT OIL OIL: 1.0000 "application " | TOPICAL_OIL | Status: DC | PRN

## 2021-01-12 MED ORDER — EPHEDRINE SULFATE-NACL 50-0.9 MG/10ML-% IV SOSY
PREFILLED_SYRINGE | INTRAVENOUS | Status: DC | PRN
Start: 2021-01-12 — End: 2021-01-12
  Administered 2021-01-12: 5 mg via INTRAVENOUS
  Administered 2021-01-12: 10 mg via INTRAVENOUS

## 2021-01-12 MED ORDER — DEXAMETHASONE SODIUM PHOSPHATE 10 MG/ML IJ SOLN
INTRAMUSCULAR | Status: DC | PRN
  Administered 2021-01-12: 10 mg via INTRAVENOUS

## 2021-01-12 MED ORDER — BUPIVACAINE IN DEXTROSE 0.75-8.25 % IT SOLN
INTRATHECAL | Status: DC | PRN
  Administered 2021-01-12: 1.6 mL via INTRATHECAL

## 2021-01-12 MED ORDER — NALOXONE HCL 0.4 MG/ML IJ SOLN: 0.4000 mg | INTRAMUSCULAR | Status: DC | PRN

## 2021-01-12 MED ORDER — KETOROLAC TROMETHAMINE 30 MG/ML IJ SOLN
30.0000 mg | Freq: Four times a day (QID) | INTRAMUSCULAR | Status: AC
  Administered 2021-01-12 – 2021-01-13 (×3): 30 mg via INTRAVENOUS
  Filled 2021-01-12 (×3): qty 1

## 2021-01-12 MED ORDER — FENTANYL CITRATE (PF) 100 MCG/2ML IJ SOLN
INTRAMUSCULAR | Status: DC | PRN
  Administered 2021-01-12: 15 ug via INTRATHECAL

## 2021-01-12 MED ORDER — SCOPOLAMINE 1 MG/3DAYS TD PT72
1.0000 | MEDICATED_PATCH | TRANSDERMAL | Status: DC
  Administered 2021-01-12: 1.5 mg via TRANSDERMAL
  Filled 2021-01-12: qty 1

## 2021-01-12 MED ORDER — BUPIVACAINE HCL (PF) 0.5 % IJ SOLN
INTRAMUSCULAR | Status: AC
  Filled 2021-01-12: qty 30

## 2021-01-12 MED ORDER — DEXAMETHASONE SODIUM PHOSPHATE 10 MG/ML IJ SOLN
INTRAMUSCULAR | Status: AC
  Filled 2021-01-12: qty 1

## 2021-01-12 MED ORDER — WITCH HAZEL-GLYCERIN EX PADS: 1.0000 "application " | MEDICATED_PAD | CUTANEOUS | Status: DC | PRN

## 2021-01-12 MED ORDER — PHENYLEPHRINE HCL-NACL 20-0.9 MG/250ML-% IV SOLN
INTRAVENOUS | Status: DC | PRN
  Administered 2021-01-12: 60 ug/min via INTRAVENOUS

## 2021-01-12 MED ORDER — MORPHINE SULFATE (PF) 0.5 MG/ML IJ SOLN
INTRAMUSCULAR | Status: AC
  Filled 2021-01-12: qty 10

## 2021-01-12 MED ORDER — MORPHINE SULFATE (PF) 0.5 MG/ML IJ SOLN
INTRAMUSCULAR | Status: DC | PRN
  Administered 2021-01-12: 150 ug via INTRATHECAL

## 2021-01-12 SURGICAL SUPPLY — 33 items
BENZOIN TINCTURE PRP APPL 2/3 (GAUZE/BANDAGES/DRESSINGS) ×2 IMPLANT
CHLORAPREP W/TINT 26ML (MISCELLANEOUS) ×2 IMPLANT
CLAMP CORD UMBIL (MISCELLANEOUS) IMPLANT
CLOTH BEACON ORANGE TIMEOUT ST (SAFETY) ×2 IMPLANT
DRSG OPSITE POSTOP 4X10 (GAUZE/BANDAGES/DRESSINGS) ×2 IMPLANT
ELECT REM PT RETURN 9FT ADLT (ELECTROSURGICAL) ×2
ELECTRODE REM PT RTRN 9FT ADLT (ELECTROSURGICAL) ×1 IMPLANT
EXTRACTOR VACUUM M CUP 4 TUBE (SUCTIONS) IMPLANT
GLOVE BIO SURGEON STRL SZ 6.5 (GLOVE) ×2 IMPLANT
GLOVE BIOGEL PI IND STRL 7.0 (GLOVE) ×2 IMPLANT
GLOVE BIOGEL PI INDICATOR 7.0 (GLOVE) ×2
GOWN STRL REUS W/TWL LRG LVL3 (GOWN DISPOSABLE) ×4 IMPLANT
KIT ABG SYR 3ML LUER SLIP (SYRINGE) IMPLANT
NEEDLE HYPO 22GX1.5 SAFETY (NEEDLE) IMPLANT
NEEDLE HYPO 25X5/8 SAFETYGLIDE (NEEDLE) IMPLANT
NS IRRIG 1000ML POUR BTL (IV SOLUTION) ×2 IMPLANT
PACK C SECTION WH (CUSTOM PROCEDURE TRAY) ×2 IMPLANT
PAD OB MATERNITY 4.3X12.25 (PERSONAL CARE ITEMS) ×2 IMPLANT
PENCIL SMOKE EVAC W/HOLSTER (ELECTROSURGICAL) ×2 IMPLANT
STRIP CLOSURE SKIN 1/2X4 (GAUZE/BANDAGES/DRESSINGS) IMPLANT
STRIP SURGICAL 1/2 X 6 IN (GAUZE/BANDAGES/DRESSINGS) ×2 IMPLANT
SUT MON AB 4-0 PS1 27 (SUTURE) ×2 IMPLANT
SUT PLAIN 0 NONE (SUTURE) IMPLANT
SUT PLAIN 2 0 XLH (SUTURE) ×2 IMPLANT
SUT VIC AB 0 CT1 36 (SUTURE) ×4 IMPLANT
SUT VIC AB 0 CTX 36 (SUTURE) ×2
SUT VIC AB 0 CTX36XBRD ANBCTRL (SUTURE) ×2 IMPLANT
SUT VIC AB 2-0 CT1 27 (SUTURE) ×1
SUT VIC AB 2-0 CT1 TAPERPNT 27 (SUTURE) ×1 IMPLANT
SYR CONTROL 10ML LL (SYRINGE) IMPLANT
TOWEL OR 17X24 6PK STRL BLUE (TOWEL DISPOSABLE) ×2 IMPLANT
TRAY FOLEY W/BAG SLVR 14FR LF (SET/KITS/TRAYS/PACK) IMPLANT
WATER STERILE IRR 1000ML POUR (IV SOLUTION) ×2 IMPLANT

## 2021-01-12 NOTE — Anesthesia Procedure Notes (Signed)
Spinal  Patient location during procedure: OB Start time: 01/12/2021 9:05 AM End time: 01/12/2021 9:10 AM Reason for block: surgical anesthesia Staffing Performed: anesthesiologist  Anesthesiologist: Lowella Curb, MD Preanesthetic Checklist Completed: patient identified, IV checked, risks and benefits discussed, surgical consent, monitors and equipment checked, pre-op evaluation and timeout performed Spinal Block Patient position: sitting Prep: DuraPrep and site prepped and draped Patient monitoring: heart rate, cardiac monitor, continuous pulse ox and blood pressure Approach: midline Location: L3-4 Injection technique: single-shot Needle Needle type: Pencan  Needle gauge: 24 G Needle length: 10 cm Assessment Sensory level: T4 Events: CSF return

## 2021-01-12 NOTE — Progress Notes (Signed)
HD#2, Covid/ gest htn  Still with cough, good FM, no ctx  Vitals:   01/11/21 2146 01/11/21 2310 01/12/21 0319 01/12/21 0600  BP: (!) 139/96 (!) 153/87 123/80   Pulse: 92 (!) 106 93   Resp:  18 18   Temp:  98.3 F (36.8 C) 98.4 F (36.9 C)   TempSrc:  Oral Oral   SpO2:  98% 99% 99%  Weight:  112.9 kg 114.7 kg   Height:  5\' 7"  (1.702 m)     Gen: no distress, cough Abd: gravid, NT, no RUQ pain LE: tr edema, NT  CBC Latest Ref Rng & Units 01/11/2021 08/19/2019 05/27/2018  WBC 4.0 - 10.5 K/uL 11.7(H) 6.6 16.1(H)  Hemoglobin 12.0 - 15.0 g/dL 05/29/2018 76.8 10.5(L)  Hematocrit 36.0 - 46.0 % 36.7 43.2 31.8(L)  Platelets 150 - 400 K/uL 170 223 148(L)   CMP Latest Ref Rng & Units 01/12/2021 01/11/2021 08/19/2019  Glucose 70 - 99 mg/dL 77 98 73  BUN 6 - 20 mg/dL 9 7 16   Creatinine 0.44 - 1.00 mg/dL 10/19/2019 7.26  Sodium 135 - 145 mmol/L 135 137 143  Potassium 3.5 - 5.1 mmol/L 4.2 4.1 4.3  Chloride 98 - 111 mmol/L 107 108 103  CO2 22 - 32 mmol/L 20(L) 20(L) 23  Calcium 8.9 - 10.3 mg/dL 2.03) 8.9 9.4  Total Protein 6.5 - 8.1 g/dL 5.59) 5.9(L) 7.0  Total Bilirubin 0.3 - 1.2 mg/dL 0.5 0.6 0.5  Alkaline Phos 38 - 126 U/L 84 90 48  AST 15 - 41 U/L 19 23 17   ALT 0 - 44 U/L 22 21 15    A/P: gest htn, no evidence PEC, needed single dose IV labetalol, no other meds for htn, Mag on hold.  Covid pos- symptomatic Prior c/s, given covid, need for delivery due to htn will plan RCS vs IOL/ TOLAC Consented for c/s  7.4(B 01/12/2021 7:17 AM

## 2021-01-12 NOTE — Transfer of Care (Signed)
Immediate Anesthesia Transfer of Care Note  Patient: Brandi Davies  Procedure(s) Performed: REPEAT CESAREAN SECTION  Patient Location: PACU  Anesthesia Type:Spinal  Level of Consciousness: awake  Airway & Oxygen Therapy: Patient Spontanous Breathing  Post-op Assessment: Report given to RN  Post vital signs: Reviewed and stable  Last Vitals:  Vitals Value Taken Time  BP    Temp    Pulse    Resp    SpO2      Last Pain:  Vitals:   01/12/21 0807  TempSrc: Oral  PainSc:       Patients Stated Pain Goal: 2 (01/11/21 2310)  Complications: No notable events documented.

## 2021-01-12 NOTE — Anesthesia Postprocedure Evaluation (Signed)
Anesthesia Post Note  Patient: Brandi Davies  Procedure(s) Performed: REPEAT CESAREAN SECTION     Patient location during evaluation: PACU Anesthesia Type: Spinal Level of consciousness: awake and alert Pain management: pain level controlled Vital Signs Assessment: post-procedure vital signs reviewed and stable Respiratory status: spontaneous breathing, nonlabored ventilation and respiratory function stable Cardiovascular status: blood pressure returned to baseline and stable Postop Assessment: no apparent nausea or vomiting Anesthetic complications: no   No notable events documented.  Last Vitals:  Vitals:   01/12/21 1129 01/12/21 1144  BP: 134/85 (!) 141/91  Pulse: 86 81  Resp: 20 20  Temp:  37.3 C  SpO2: 100% 98%    Last Pain:  Vitals:   01/12/21 1144  TempSrc: Oral  PainSc: 0-No pain   Pain Goal: Patients Stated Pain Goal: 2 (01/11/21 2310)              Epidural/Spinal Function Cutaneous sensation: Tingles (01/12/21 1144), Patient able to flex knees: Yes (01/12/21 1144), Patient able to lift hips off bed: Yes (01/12/21 1144), Back pain beyond tenderness at insertion site: No (01/12/21 1144), Progressively worsening motor and/or sensory loss: No (01/12/21 1144), Bowel and/or bladder incontinence post epidural: No (01/12/21 1144)  Lowella Curb

## 2021-01-12 NOTE — Op Note (Signed)
01/11/2021 - 01/12/2021  10:18 AM  PATIENT:  Brandi Davies  35 y.o. female  PRE-OPERATIVE DIAGNOSIS:  repeat cesarean section x1 COVID + pre-eclampsia  POST-OPERATIVE DIAGNOSIS:  repeat cesarean section x1 COVID + pre-eclampsi  PROCEDURE:  Procedure(s): REPEAT CESAREAN SECTION (N/A) Low-transverse cesarean section with 2 layer closure  SURGEON:  Surgeon(s) and Role:    Noland Fordyce, MD - Primary  PHYSICIAN ASSISTANT: Dorisann Frames, CNM  ASSISTANTS: As above  ANESTHESIA:   spinal  EBL:  422 mL   BLOOD ADMINISTERED:none  DRAINS: Urinary Catheter (Foley)   LOCAL MEDICATIONS USED:  MARCAINE     SPECIMEN: Placenta  DISPOSITION OF SPECIMEN:  To labor and delivery for disposal  COUNTS:  YES  TOURNIQUET:  * No tourniquets in log *  DICTATION: .Note written in EPIC  PLAN OF CARE: Admit to inpatient   PATIENT DISPOSITION:  PACU - hemodynamically stable.   Delay start of Pharmacological VTE agent (>24hrs) due to surgical blood loss or risk of bleeding: yes    Findings:  @BABYSEXEBC @ infant,  APGAR (1 MIN):   APGAR (5 MINS):   APGAR (10 MINS):   Normal uterus, tubes and ovaries, normal placenta. 3VC, clear amniotic fluid  EBL: Per nursing notes cc Antibiotics:   2g Ancef Complications: none  Indications: This is a 35 y.o. year-old, G2, P1 at [redacted]w[redacted]d admitted for gestational hypertension.  Patient with prior cesarean section and initially had plans for trial of labor after cesarean however given the fact that she would need an induced labor with a very high fetal station and unripe cervix and in the setting of active symptomatic COVID disease decision was made to proceed with repeat cesarean section. Risks benefits and alternatives of the procedure were discussed with the patient who agreed to proceed  Procedure:  After informed consent was obtained the patient was taken to the operating room where spinal anesthesia was initiated.  She was prepped and draped in the  normal sterile fashion in dorsal supine position with a leftward tilt.  A foley catheter was in place.  A Pfannenstiel skin incision was made 2 cm above the pubic symphysis in the midline with the scalpel, over the old Pfannenstiel incision dissection was carried down with the Bovie cautery until the fascia was reached. The fascia was incised in the midline. The incision was extended laterally with the Mayo scissors. The inferior aspect of the fascial incision was grasped with the Coker clamps, elevated up and the underlying rectus muscles were dissected off sharply. The superior aspect of the fascial incision was grasped with the Coker clamps elevated up and the underlying rectus muscles were dissected off sharply.  The peritoneum was entered bluntly. The peritoneal incision was extended superiorly and inferiorly with good visualization of the bladder. The bladder blade was inserted and palpation was done to assess the fetal position and the location of the uterine vessels.  A bladder flap was created sharply.  The lower segment of the uterus was incised sharply with the scalpel and extended  bluntly in the cephalo-caudal fashion. The infant was grasped, brought to the incision,  rotated and the infant was delivered with fundal pressure. The nose and mouth were bulb suctioned. The cord was clamped and cut after 1 minute delay. The infant was handed off to the waiting pediatrician. The placenta was expressed. The uterus was exteriorized. The uterus was cleared of all clots and debris. The uterine incision was repaired with 0 Vicryl in a running locked fashion.  A second layer of the same suture was used in an imbricating fashion to obtain excellent hemostasis.  The uterus was then returned to the abdomen, the gutters were cleared of all clots and debris. The uterine incision was reinspected and found to be hemostatic. The peritoneum was grasped and closed with 2-0 Vicryl in a running fashion. The cut muscle edges  and the underside of the fascia were inspected and found to be hemostatic. The fascia was closed with 0 Vicryl in a single layer. The subcutaneous tissue was irrigated. Scarpa's layer was closed with a 2-0 plain gut suture. The skin was closed with a 4-0 Monocryl in a single layer. The patient tolerated the procedure well. Sponge lap and needle counts were correct x3 and patient was taken to the recovery room in a stable condition.  Lendon Colonel 01/12/2021 10:20 AM

## 2021-01-12 NOTE — Social Work (Signed)
MOB was referred for history of anxiety.   * Referral screened out by Clinical Social Worker because none of the following criteria appear to apply:  ~ History of anxiety/depression during this pregnancy, or of post-partum depression following prior delivery. ~ Diagnosis of anxiety and/or depression within last 3 years. OR * MOB's symptoms currently being treated with medication and/or therapy. CSW reviewed chart and notes MOB currently prescribed Celexa 20mg .   Please contact the Clinical Social Worker if needs arise, by [facility] request, or if MOB scores greater than 9/yes to question 10 on Edinburgh Postpartum Depression Screen.  08-16-1975, LCSWA Clinical Social Work [facility] [employer]  [phone]

## 2021-01-12 NOTE — Brief Op Note (Signed)
01/11/2021 - 01/12/2021  10:18 AM  PATIENT:  Brandi Davies  35 y.o. female  PRE-OPERATIVE DIAGNOSIS:  repeat cesarean section x1 COVID + pre-eclampsia  POST-OPERATIVE DIAGNOSIS:  repeat cesarean section x1 COVID + pre-eclampsi  PROCEDURE:  Procedure(s): REPEAT CESAREAN SECTION (N/A) Low-transverse cesarean section with 2 layer closure  SURGEON:  Surgeon(s) and Role:    Noland Fordyce, MD - Primary  PHYSICIAN ASSISTANT: Dorisann Frames, CNM  ASSISTANTS: As above  ANESTHESIA:   spinal  EBL:  422 mL   BLOOD ADMINISTERED:none  DRAINS: Urinary Catheter (Foley)   LOCAL MEDICATIONS USED:  MARCAINE     SPECIMEN: Placenta  DISPOSITION OF SPECIMEN:   To labor and delivery for disposal  COUNTS:  YES  TOURNIQUET:  * No tourniquets in log *  DICTATION: .Note written in EPIC  PLAN OF CARE: Admit to inpatient   PATIENT DISPOSITION:  PACU - hemodynamically stable.   Delay start of Pharmacological VTE agent (>24hrs) due to surgical blood loss or risk of bleeding: yes

## 2021-01-12 NOTE — Anesthesia Preprocedure Evaluation (Signed)
Anesthesia Evaluation  Patient identified by MRN, date of birth, ID band Patient awake    Reviewed: Allergy & Precautions, Patient's Chart, lab work & pertinent test results  Airway Mallampati: II       Dental no notable dental hx.    Pulmonary asthma ,    Pulmonary exam normal        Cardiovascular hypertension, Pt. on medications Normal cardiovascular exam     Neuro/Psych Anxiety    GI/Hepatic negative GI ROS, Neg liver ROS,   Endo/Other  negative endocrine ROS  Renal/GU negative Renal ROS     Musculoskeletal   Abdominal (+) + obese,   Peds  Hematology negative hematology ROS (+)   Anesthesia Other Findings Covid +  Reproductive/Obstetrics (+) Pregnancy                             Anesthesia Physical  Anesthesia Plan  ASA: 3  Anesthesia Plan: Spinal   Post-op Pain Management:    Induction:   PONV Risk Score and Plan: 2 and Treatment may vary due to age or medical condition  Airway Management Planned: Natural Airway  Additional Equipment: None  Intra-op Plan:   Post-operative Plan:   Informed Consent: I have reviewed the patients History and Physical, chart, labs and discussed the procedure including the risks, benefits and alternatives for the proposed anesthesia with the patient or authorized representative who has indicated his/her understanding and acceptance.       Plan Discussed with:   Anesthesia Plan Comments:         Anesthesia Quick Evaluation

## 2021-01-12 NOTE — Lactation Note (Signed)
This note was copied from a baby's chart. Lactation Consultation Note  Patient Name: Brandi Davies [id number]'N Date: 01/12/2021 Reason for consult: Initial assessment;Term Age:33 hours  LC in to room for initial visit. Mother hand expresses and latches independently laid back position. "Denny Peon" is suckling and swallowing. Assisted for a deeper latch with chin tug. Right breast & nipple WNL.  Talked about normal newborn behavior/patterns, expected output and tummy size. Spoonfeed as needed.  Encouraged maternal rest, hydration and food intake.   All questions answered at this time. Provided Lactation services brochure and promoted [employer] booklet information. Contact LC as needed for feeds/support/concerns/questions   Maternal Data Has patient been taught Hand Expression?: Yes Does the patient have breastfeeding experience prior to this delivery?: Yes How long did the patient breastfeed?: >2 years. stopped breastfeeding with this pregnancy  Feeding Mother's Current Feeding Choice: Breast Milk  LATCH Score Latch: Grasps breast easily, tongue down, lips flanged, rhythmical sucking.  Audible Swallowing: Spontaneous and intermittent  Type of Nipple: Everted at rest and after stimulation  Comfort (Breast/Nipple): Soft / non-tender  Hold (Positioning): Assistance needed to correctly position infant at breast and maintain latch.  LATCH Score: 9   Lactation Tools Discussed/Used    Interventions Interventions: Breast feeding basics reviewed;Assisted with latch;Skin to skin;Breast massage;Hand express;Adjust position;Support pillows;Expressed milk;Education  Discharge Pump: Personal WIC Program: No  Consult Status Consult Status: Follow-up Date: 01/13/21 Follow-up type: In-patient    Gurpreet Mikhail A Higuera Ancidey 01/12/2021, 9:29 PM

## 2021-01-12 NOTE — Progress Notes (Signed)
RN went in to administer tylenol but pt stated she self medicated herself. Pt educated about self medicating while in the hospital.

## 2021-01-13 LAB — CBC
HCT: 30.1 % — ABNORMAL LOW (ref 36.0–46.0)
Hemoglobin: 10 g/dL — ABNORMAL LOW (ref 12.0–15.0)
MCH: 31 pg (ref 26.0–34.0)
MCHC: 33.2 g/dL (ref 30.0–36.0)
MCV: 93.2 fL (ref 80.0–100.0)
Platelets: 127 10*3/uL — ABNORMAL LOW (ref 150–400)
RBC: 3.23 MIL/uL — ABNORMAL LOW (ref 3.87–5.11)
RDW: 14.3 % (ref 11.5–15.5)
WBC: 12.9 10*3/uL — ABNORMAL HIGH (ref 4.0–10.5)
nRBC: 0 % (ref 0.0–0.2)

## 2021-01-13 NOTE — Progress Notes (Addendum)
Subjective: POD# 1 Live born female  Birth Weight: 8 lb 8.3 oz (3865 g) APGAR: 8, 8  Newborn Delivery   Birth date/time: 01/12/2021 09:40:00 Delivery type: C-Section, Low Transverse Trial of labor: No C-section categorization: Repeat     Baby name: Denny Peon Delivering provider: Noland Fordyce   Feeding: breast  Pain control at delivery: Spinal   Reports feeling tired but well, cough same, no SOB.  Patient reports tolerating PO.   Breast symptoms:+ colostrum, latching well Pain controlled with  PO meds Denies HA/SOB/C/P/N/V/dizziness. Flatus small. She reports vaginal bleeding as normal, without clots.  She is ambulating, foley DCed this am no void attempt yet.      Objective:   VS:    Vitals:   01/12/21 1642 01/12/21 2028 01/13/21 0029 01/13/21 0535  BP: (!) 139/91 119/72 119/78 115/81  Pulse: 84 68 62 (!) 56  Resp: 18 20 20 20   Temp: 98.2 F (36.8 C) 98.4 F (36.9 C) 98 F (36.7 C) 98.1 F (36.7 C)  TempSrc: Oral Oral  Oral  SpO2:  99% 97% 100%  Weight:      Height:         Intake/Output Summary (Last 24 hours) at 01/13/2021 0846 Last data filed at 01/13/2021 0532 Gross per 24 hour  Intake 1000 ml  Output 2931 ml  Net -1931 ml        Recent Labs    01/12/21 0504 01/13/21 0418  WBC 14.2* 12.9*  HGB 12.0 10.0*  HCT 35.9* 30.1*  PLT 143* 127*   CMP Latest Ref Rng & Units 01/12/2021 01/11/2021 08/19/2019  Glucose 70 - 99 mg/dL 77 98 73  BUN 6 - 20 mg/dL 9 7 16   Creatinine 0.44 - 1.00 mg/dL 10/19/2019 8.65  Sodium 135 - 145 mmol/L 135 137 143  Potassium 3.5 - 5.1 mmol/L 4.2 4.1 4.3  Chloride 98 - 111 mmol/L 107 108 103  CO2 22 - 32 mmol/L 20(L) 20(L) 23  Calcium 8.9 - 10.3 mg/dL 7.84) 8.9 9.4  Total Protein 6.5 - 8.1 g/dL 6.96) 5.9(L) 7.0  Total Bilirubin 0.3 - 1.2 mg/dL 0.5 0.6 0.5  Alkaline Phos 38 - 126 U/L 84 90 48  AST 15 - 41 U/L 19 23 17   ALT 0 - 44 U/L 22 21 15       Blood type: --/--/AB POS (08/04 1640)  Rubella:   immune Vaccines: TDaP           UTD                    COVID-19 UTD   Physical Exam:  General: alert, cooperative, and no distress CV: Regular rate and rhythm Resp: mild ronchy R side, clear otherwise Abdomen: soft, nontender, normal bowel sounds Incision: dry, intact, and serous drainage present Uterine Fundus: firm, below umbilicus, nontender Lochia: minimal Ext: edema trace pedal, no cords or tenderness  Assessment/Plan: 35 y.o.   POD# 1.                  Principal Problem:   Postpartum care following cesarean delivery 8/5 Active Problems:   Anxiety  - stable on Celexa 20 mg daily  - monitor closely for PPD    Gestational hypertension  - mild range labile BP but normotensive overnight, no neural sx  - labs stable with small drop in platelets  - continue to monitor   Status post repeat low transverse cesarean section 8/5   COVID-19  - mild  sx, stable, s/p vaccination  Doing well, stable.               Advance diet as tolerated Encourage rest when baby rests Breastfeeding support Encourage to ambulate Routine post-op care  Lovenox DCed as patient is ambulatory  POC in consult w/ Dr. Alvin Critchley, CNM, MSN 01/13/2021, 8:46 AM

## 2021-01-14 MED ORDER — ACETAMINOPHEN 500 MG PO TABS: 1000.0000 mg | ORAL_TABLET | Freq: Four times a day (QID) | ORAL | 0 refills | Status: DC

## 2021-01-14 MED ORDER — IBUPROFEN 600 MG PO TABS: 600.0000 mg | ORAL_TABLET | Freq: Four times a day (QID) | ORAL | 0 refills | Status: DC

## 2021-01-14 MED ORDER — COCONUT OIL OIL: 1.0000 "application " | TOPICAL_OIL | 0 refills | Status: DC | PRN

## 2021-01-14 MED ORDER — SIMETHICONE 80 MG PO CHEW: 80.0000 mg | CHEWABLE_TABLET | ORAL | 0 refills | Status: DC | PRN

## 2021-01-14 MED ORDER — OXYCODONE HCL 5 MG PO TABS: 5.0000 mg | ORAL_TABLET | Freq: Four times a day (QID) | ORAL | 0 refills | Status: DC | PRN

## 2021-01-14 NOTE — Discharge Instructions (Signed)
Lactation outpatient support - home visit   Jessica Bowers, IBCLC (lactation consultant)  & Birth Doula  Phone (text or call): [phone] Email: [email or web address]   Linda Coppola RN, MHA, IBCLC at [facility]: Lactation Consultant  [email or web address] Mail: [email or web address] Tel: [phone]    Additional breastfeeding resources:  [facility] [email or web address]://ibconline.ca/information-[email or web address]  [employer] [facility] [place]  [email or web address]  Chiropractic specialist   Dr. Leanna Hastings [email or web address]   Craniosacral therapy for baby  Erin Balkind  [email or web address]  

## 2021-01-14 NOTE — Lactation Note (Signed)
This note was copied from a baby's chart. Lactation Consultation Note  Patient Name: Girl Parlee Amescua [id number] Date: 01/14/2021 Reason for consult: Follow-up assessment Age:33 hours  P2, Infant is 9% wt loss. Mother was observed with infant in cradle hold. Infant had a shallow latch. Observed that infant was not swallowing.  Mother assist with relatching infant . Observed that mothers nipple was pinched.  Assist with deeper latch. Observed infant with chewing behavior pattern. and then a pattern of suckling and swallows.  Infant sustained latch for 30 mins.  Observed that infant has a tight upper lip and a tight posterior tongue tie.  Mother reports that her daughter had a tongue tie and she has it revised at Dr [employer]. Suggested that patient follow up with OP LC. Mother agreeable.  Encouraged to continue to rotate positions .  Suggested pumping and feeding back to infant. Mother has [facility] resource numbers as needed.    Maternal Data    Feeding Mother's Current Feeding Choice: Breast Milk  LATCH Score Latch: Grasps breast easily, tongue down, lips flanged, rhythmical sucking.  Audible Swallowing: Spontaneous and intermittent  Type of Nipple: Everted at rest and after stimulation  Comfort (Breast/Nipple): Soft / non-tender  Hold (Positioning): Assistance needed to correctly position infant at breast and maintain latch.  LATCH Score: 9   Lactation Tools Discussed/Used    Interventions Interventions: Assisted with latch;Skin to skin;Hand express;Adjust position;Support pillows;Position options  Discharge Discharge Education: Engorgement and breast care;Warning signs for feeding baby;Outpatient recommendation;Outpatient Epic message sent Pump: Personal ([facility], [facility] Go)  Consult Status Consult Status: Complete (as needed)    Michel Bickers 01/14/2021, 11:43 AM

## 2021-01-14 NOTE — Discharge Summary (Signed)
OB Discharge Summary  Patient Name: Brandi Davies DOB: 1985-07-24 MRN: [id number]  Date of admission: 01/11/2021 Delivering provider: Noland Fordyce   Admitting diagnosis: Gestational hypertension [O13.9] Intrauterine pregnancy: [redacted]w[redacted]d     Secondary diagnosis: Patient Active Problem List   Diagnosis Date Noted   Status post repeat low transverse cesarean section 8/5 01/12/2021   COVID-19 01/12/2021   Postpartum care following cesarean delivery 8/5 01/12/2021   Gestational hypertension 01/11/2021   Anxiety 05/26/2018   Additional problems:none   Date of discharge: 01/14/2021   Discharge diagnosis: Principal Problem:   Postpartum care following cesarean delivery 8/5 Active Problems:   Anxiety   Gestational hypertension   Status post repeat low transverse cesarean section 8/5   COVID-19                                                              Post partum procedures: none  Augmentation: N/A Pain control: Spinal  Laceration:None  Episiotomy:None  Complications: None  Hospital course:  Sceduled C/S   35 y.o. yo G2, P1 at [redacted]w[redacted]d admitted for gestational hypertension.  Patient with prior cesarean section and initially had plans for trial of labor after cesarean however given the fact that she would need an induced labor with a very high fetal station and unripe cervix and in the setting of active symptomatic COVID disease decision was made to proceed with repeat cesarean section. Delivery details are as follows:  Membrane Rupture Time/Date: 9:40 AM ,01/12/2021   Delivery Method:C-Section, Low Transverse  Details of operation can be found in separate operative note.  Patient had an uncomplicated postpartum course.  She is ambulating, tolerating a regular diet, passing flatus, and urinating well. Patient is discharged home in stable condition on  01/14/21. BP remains mildly labile and no neural symptoms. Cough has improved and patient reports feeling much better on day of discharge.  Preeclampsia precautions reviewed and close follow up in office in 1 week for blood pressure check.         Newborn Data: Birth date:01/12/2021  Birth time:9:40 AM  Gender:Female  Living status:Living  Apgars:8 ,8  Weight:3865 g     Physical exam  Vitals:   01/13/21 0535 01/13/21 1700 01/13/21 2112 01/14/21 0512  BP: 115/81 (!) 138/94 136/90 124/87  Pulse: (!) 56 (!) 57 67 71  Resp: 20 20 19 18   Temp: 98.1 F (36.7 C) 98.6 F (37 C) 97.8 F (36.6 C) 98.1 F (36.7 C)  TempSrc: Oral  Oral Oral  SpO2: 100%  100% 98%  Weight:      Height:       General: alert, cooperative, and no distress Lochia: appropriate Uterine Fundus: firm Incision: Dressing is clean, dry, and intact DVT Evaluation: No significant calf/ankle edema. Labs: Lab Results  Component Value Date   WBC 12.9 (H) 01/13/2021   HGB 10.0 (L) 01/13/2021   HCT 30.1 (L) 01/13/2021   MCV 93.2 01/13/2021   PLT 127 (L) 01/13/2021   CMP Latest Ref Rng & Units 01/12/2021  Glucose 70 - 99 mg/dL 77  BUN 6 - 20 mg/dL 9  Creatinine 03/14/2021 - 3.22 mg/dL 0.25  Sodium 4.27 - 062 mmol/L 135  Potassium 3.5 - 5.1 mmol/L 4.2  Chloride 98 - 111 mmol/L 107  CO2 22 - 32 mmol/L  20(L)  Calcium 8.9 - 10.3 mg/dL 1.5(Q)  Total Protein 6.5 - 8.1 g/dL 0.0(Q)  Total Bilirubin 0.3 - 1.2 mg/dL 0.5  Alkaline Phos 38 - 126 U/L 84  AST 15 - 41 U/L 19  ALT 0 - 44 U/L 22   Edinburgh Postnatal Depression Scale Screening Tool 05/28/2018 05/26/2018  I have been able to laugh and see the funny side of things. 0 (No Data)  I have looked forward with enjoyment to things. 0 -  I have blamed myself unnecessarily when things went wrong. 0 -  I have been anxious or worried for no good reason. 2 -  I have felt scared or panicky for no good reason. 0 -  Things have been getting on top of me. 0 -  I have been so unhappy that I have had difficulty sleeping. 0 -  I have felt sad or miserable. 0 -  I have been so unhappy that I have been crying. 0 -  The  thought of harming myself has occurred to me. 0 -  Edinburgh Postnatal Depression Scale Total 2 -   Vaccines: TDaP          UTD        COVID-19   UTD  Discharge instruction:  per After Visit Summary,  [facility] OB booklet and  "Understanding Mother & Baby Care" hospital booklet  After Visit Meds:  Allergies as of 01/14/2021       Reactions   Sulfa Antibiotics Swelling        Medication List     STOP taking these medications    norethindrone 0.35 MG tablet Commonly known as: MICRONOR       TAKE these medications    acetaminophen 500 MG tablet Commonly known as: TYLENOL Take 2 tablets (1,000 mg total) by mouth every 6 (six) hours.   albuterol 108 (90 Base) MCG/ACT inhaler Commonly known as: VENTOLIN HFA Inhale 2 puffs into the lungs every 6 (six) hours as needed for wheezing or shortness of breath.   citalopram 20 MG tablet Commonly known as: CELEXA TAKE 1 TABLET BY MOUTH  DAILY   coconut oil Oil Apply 1 application topically as needed.   docusate sodium 100 MG capsule Commonly known as: COLACE Take 100 mg by mouth 2 (two) times daily.   ibuprofen 600 MG tablet Commonly known as: ADVIL Take 1 tablet (600 mg total) by mouth every 6 (six) hours.   oxyCODONE 5 MG immediate release tablet Commonly known as: Oxy IR/ROXICODONE Take 1 tablet (5 mg total) by mouth every 6 (six) hours as needed for up to 5 days for moderate pain.   prenatal multivitamin Tabs tablet Take 2 tablets by mouth daily at 12 noon.   simethicone 80 MG chewable tablet Commonly known as: MYLICON Chew 1 tablet (80 mg total) by mouth as needed for flatulence.        Diet: routine diet  Activity: Advance as tolerated. Pelvic rest for 6 weeks.   Postpartum contraception: Not Discussed  Newborn Data: Live born female  Birth Weight: 8 lb 8.3 oz (3865 g) APGAR: 8, 8  Newborn Delivery   Birth date/time: 01/12/2021 09:40:00 Delivery type: C-Section, Low Transverse Trial of labor:  No C-section categorization: Repeat      named Denny Peon Baby Feeding: Breast Disposition:home with mother   Delivery Report:  Review the Delivery Report for details.    Follow up:  Follow-up Information     Shea Evans, MD. Schedule an appointment as  soon as possible for a visit in 1 week(s).   Specialty: Obstetrics and Gynecology Why: For Postpartum follow-up Contact information: [address] [place] [address] [phone]                   Signed: Neta Mends, CNM, MSN 01/14/2021, 9:55 AM

## 2021-01-16 ENCOUNTER — Inpatient Hospital Stay (HOSPITAL_COMMUNITY): Admit: 2021-01-16 | Payer: Self-pay

## 2021-01-19 ENCOUNTER — Other Ambulatory Visit: Payer: Self-pay

## 2021-01-19 ENCOUNTER — Inpatient Hospital Stay (HOSPITAL_COMMUNITY)
Admission: AD | Admit: 2021-01-19 | Discharge: 2021-01-22 | DRG: 776 | Disposition: A | Discharge status: Home / Self Care | Payer: 59 | Attending: Obstetrics and Gynecology | Admitting: Obstetrics and Gynecology

## 2021-01-19 ENCOUNTER — Encounter (HOSPITAL_COMMUNITY): Payer: Self-pay | Admitting: Obstetrics and Gynecology

## 2021-01-19 ENCOUNTER — Inpatient Hospital Stay (HOSPITAL_COMMUNITY): Payer: 59

## 2021-01-19 DIAGNOSIS — O9853 Other viral diseases complicating the puerperium: Secondary | ICD-10-CM | POA: Diagnosis present

## 2021-01-19 DIAGNOSIS — O1415 Severe pre-eclampsia, complicating the puerperium: Secondary | ICD-10-CM | POA: Diagnosis present

## 2021-01-19 DIAGNOSIS — R0602 Shortness of breath: Secondary | ICD-10-CM

## 2021-01-19 DIAGNOSIS — U071 COVID-19: Secondary | ICD-10-CM | POA: Diagnosis present

## 2021-01-19 DIAGNOSIS — R030 Elevated blood-pressure reading, without diagnosis of hypertension: Secondary | ICD-10-CM | POA: Diagnosis not present

## 2021-01-19 DIAGNOSIS — F419 Anxiety disorder, unspecified: Secondary | ICD-10-CM | POA: Diagnosis present

## 2021-01-19 DIAGNOSIS — O99345 Other mental disorders complicating the puerperium: Secondary | ICD-10-CM | POA: Diagnosis present

## 2021-01-19 LAB — CBC WITH DIFFERENTIAL/PLATELET
Abs Immature Granulocytes: 0.06 10*3/uL (ref 0.00–0.07)
Basophils Absolute: 0 10*3/uL (ref 0.0–0.1)
Basophils Relative: 0 %
Eosinophils Absolute: 0.2 10*3/uL (ref 0.0–0.5)
Eosinophils Relative: 2 %
HCT: 38.1 % (ref 36.0–46.0)
Hemoglobin: 12.7 g/dL (ref 12.0–15.0)
Immature Granulocytes: 1 %
Lymphocytes Relative: 22 %
Lymphs Abs: 2.4 10*3/uL (ref 0.7–4.0)
MCH: 30.8 pg (ref 26.0–34.0)
MCHC: 33.3 g/dL (ref 30.0–36.0)
MCV: 92.3 fL (ref 80.0–100.0)
Monocytes Absolute: 0.5 10*3/uL (ref 0.1–1.0)
Monocytes Relative: 4 %
Neutro Abs: 7.8 10*3/uL — ABNORMAL HIGH (ref 1.7–7.7)
Neutrophils Relative %: 71 %
Platelets: 309 10*3/uL (ref 150–400)
RBC: 4.13 MIL/uL (ref 3.87–5.11)
RDW: 13.6 % (ref 11.5–15.5)
WBC: 10.8 10*3/uL — ABNORMAL HIGH (ref 4.0–10.5)
nRBC: 0 % (ref 0.0–0.2)

## 2021-01-19 LAB — COMPREHENSIVE METABOLIC PANEL
ALT: 38 U/L (ref 0–44)
AST: 23 U/L (ref 15–41)
Albumin: 3.2 g/dL — ABNORMAL LOW (ref 3.5–5.0)
Alkaline Phosphatase: 57 U/L (ref 38–126)
Anion gap: 8 (ref 5–15)
BUN: 18 mg/dL (ref 6–20)
CO2: 22 mmol/L (ref 22–32)
Calcium: 8.8 mg/dL — ABNORMAL LOW (ref 8.9–10.3)
Chloride: 105 mmol/L (ref 98–111)
Creatinine, Ser: 0.62 mg/dL (ref 0.44–1.00)
GFR, Estimated: >60 mL/min (ref >60)
Glucose, Bld: 96 mg/dL (ref 70–99)
Potassium: 3.9 mmol/L (ref 3.5–5.1)
Sodium: 135 mmol/L (ref 135–145)
Total Bilirubin: 0.2 mg/dL — ABNORMAL LOW (ref 0.3–1.2)
Total Protein: 6.7 g/dL (ref 6.5–8.1)

## 2021-01-19 LAB — BRAIN NATRIURETIC PEPTIDE: B Natriuretic Peptide: 229.3 pg/mL — ABNORMAL HIGH (ref 0.0–100.0)

## 2021-01-19 LAB — TROPONIN I (HIGH SENSITIVITY): Troponin I (High Sensitivity): 6 ng/L (ref <18)

## 2021-01-19 MED ORDER — LABETALOL HCL 5 MG/ML IV SOLN: 20.0000 mg | INTRAVENOUS | Status: DC | PRN

## 2021-01-19 MED ORDER — CALCIUM CARBONATE ANTACID 500 MG PO CHEW: 2.0000 | CHEWABLE_TABLET | ORAL | Status: DC | PRN

## 2021-01-19 MED ORDER — LABETALOL HCL 5 MG/ML IV SOLN: 40.0000 mg | INTRAVENOUS | Status: DC | PRN

## 2021-01-19 MED ORDER — MAGNESIUM SULFATE 40 GM/1000ML IV SOLN
2.0000 g/h | INTRAVENOUS | Status: DC
  Administered 2021-01-19 – 2021-01-20 (×2): 2 g/h via INTRAVENOUS
  Filled 2021-01-19 (×2): qty 1000

## 2021-01-19 MED ORDER — HYDRALAZINE HCL 20 MG/ML IJ SOLN: 10.0000 mg | INTRAMUSCULAR | Status: DC | PRN

## 2021-01-19 MED ORDER — CITALOPRAM HYDROBROMIDE 10 MG PO TABS
20.0000 mg | ORAL_TABLET | Freq: Every day | ORAL | Status: DC
  Administered 2021-01-20 – 2021-01-22 (×3): 20 mg via ORAL
  Filled 2021-01-19 (×3): qty 2

## 2021-01-19 MED ORDER — NIFEDIPINE 10 MG PO CAPS
10.0000 mg | ORAL_CAPSULE | ORAL | Status: DC | PRN
  Administered 2021-01-19: 10 mg via ORAL
  Filled 2021-01-19: qty 1

## 2021-01-19 MED ORDER — LABETALOL HCL 5 MG/ML IV SOLN: 80.0000 mg | INTRAVENOUS | Status: DC | PRN

## 2021-01-19 MED ORDER — LABETALOL HCL 5 MG/ML IV SOLN
40.0000 mg | INTRAVENOUS | Status: DC | PRN
  Administered 2021-01-19: 40 mg via INTRAVENOUS

## 2021-01-19 MED ORDER — ZOLPIDEM TARTRATE 5 MG PO TABS: 5.0000 mg | ORAL_TABLET | Freq: Every evening | ORAL | Status: DC | PRN

## 2021-01-19 MED ORDER — HYDRALAZINE HCL 20 MG/ML IJ SOLN
5.0000 mg | INTRAMUSCULAR | Status: DC | PRN
Start: 2021-01-19 — End: 2021-01-19
  Filled 2021-01-19: qty 1

## 2021-01-19 MED ORDER — DOCUSATE SODIUM 100 MG PO CAPS
100.0000 mg | ORAL_CAPSULE | Freq: Every day | ORAL | Status: DC
  Administered 2021-01-20 – 2021-01-22 (×3): 100 mg via ORAL
  Filled 2021-01-19 (×3): qty 1

## 2021-01-19 MED ORDER — HYDRALAZINE HCL 20 MG/ML IJ SOLN
10.0000 mg | INTRAMUSCULAR | Status: DC | PRN
  Administered 2021-01-19: 10 mg via INTRAVENOUS

## 2021-01-19 MED ORDER — LACTATED RINGERS IV SOLN: INTRAVENOUS | Status: DC

## 2021-01-19 MED ORDER — PRENATAL MULTIVITAMIN CH
1.0000 | ORAL_TABLET | Freq: Every day | ORAL | Status: DC
  Administered 2021-01-20 – 2021-01-22 (×3): 1 via ORAL
  Filled 2021-01-19 (×3): qty 1

## 2021-01-19 MED ORDER — GUAIFENESIN ER 600 MG PO TB12
1200.0000 mg | ORAL_TABLET | Freq: Two times a day (BID) | ORAL | Status: DC | PRN
  Administered 2021-01-20 – 2021-01-22 (×4): 600 mg via ORAL
  Filled 2021-01-19 (×5): qty 2

## 2021-01-19 MED ORDER — MAGNESIUM SULFATE BOLUS VIA INFUSION
4.0000 g | Freq: Once | INTRAVENOUS | Status: AC
  Administered 2021-01-19: 4 g via INTRAVENOUS
  Filled 2021-01-19: qty 1000

## 2021-01-19 MED ORDER — LABETALOL HCL 5 MG/ML IV SOLN
40.0000 mg | INTRAVENOUS | Status: DC | PRN
  Filled 2021-01-19: qty 8

## 2021-01-19 MED ORDER — ACETAMINOPHEN 325 MG PO TABS
650.0000 mg | ORAL_TABLET | ORAL | Status: DC | PRN
  Administered 2021-01-20 – 2021-01-21 (×4): 650 mg via ORAL
  Filled 2021-01-19 (×4): qty 2

## 2021-01-19 MED ORDER — NIFEDIPINE ER OSMOTIC RELEASE 30 MG PO TB24
30.0000 mg | ORAL_TABLET | Freq: Every day | ORAL | Status: DC
  Administered 2021-01-19 – 2021-01-20 (×2): 30 mg via ORAL
  Filled 2021-01-19 (×2): qty 1

## 2021-01-19 MED ORDER — NIFEDIPINE 10 MG PO CAPS
20.0000 mg | ORAL_CAPSULE | ORAL | Status: DC | PRN
  Filled 2021-01-19: qty 2

## 2021-01-19 MED ORDER — LABETALOL HCL 5 MG/ML IV SOLN
20.0000 mg | INTRAVENOUS | Status: DC | PRN
  Administered 2021-01-19: 20 mg via INTRAVENOUS
  Filled 2021-01-19: qty 4

## 2021-01-19 NOTE — H&P (Signed)
Brandi Davies is a 35 y.o. female G2P2002 PPD#7 s/p repeat cesarean section presenting with new severe range BP and complaints of HA. Patient had new diagnosis of gHTN at time of presentation with SROM at 39 weeks. She did receive a 1 time push of IV labetalol intrapartum but had normal PIH labs and never received Magnesium. BP's normalized PP and was discharged home without requiring medications. Patient had been monitoring BP's at home and called into office today reporting BP of 170/110. Patient had tested positive for COVID prior to delivery and was symptomatic at time, now with residual cough. Minor HA today but attributes this to dehydration. Denies any vision changes. No CP or palpitations. Minor SOB when performing activities but none at rest. No RUQ or epigastric pain. States swelling in LE has gone down significantly since delivery.   Baby girl 'Denny Peon' is doing well at bedside. Currently breastfeeding without difficulties. Remainder of patient's family is also sick with COVID, so mother in [place] is traveling in from [place] in order to watch the baby while patient is being admitted for Magnesium therapy and blood pressure control.  Patient did develop elevated BP in the end of her first pregnancy but never developed PEC, required Magnesium, or placed on BP medications. Medical history significant for anxiety well controlled on Celexa 20mg  daily. Recovering well from a postoperative standpoint.   OB History     Gravida  2   Para  2   Term  2   Preterm  0   AB  0   Living  2      SAB  0   IAB  0   Ectopic  0   Multiple  0   Live Births  2          Past Medical History:  Diagnosis Date   Abnormal Pap smear of cervix    Anxiety    Anxiety 05/26/2018   Asthma    Asthma, chronic 05/26/2018   Past Surgical History:  Procedure Laterality Date   CESAREAN SECTION N/A 05/26/2018   Procedure: CESAREAN SECTION;  Surgeon: 05/28/2018, MD;  Location: [facility] BIRTHING SUITES;   Service: Obstetrics;  Laterality: N/A;   CESAREAN SECTION N/A 01/12/2021   Procedure: REPEAT CESAREAN SECTION;  Surgeon: 03/14/2021, MD;  Location: [facility] LD ORS;  Service: Obstetrics;  Laterality: N/A;   LEEP  2017   MOUTH SURGERY     WISDOM TOOTH EXTRACTION     Family History: family history includes Heart attack in her paternal grandfather and paternal grandmother; Heart disease in her paternal grandmother; Stomach cancer in her maternal grandfather and maternal grandmother; Stroke in her paternal grandmother. Social History:  reports that she has never smoked. She has never used smokeless tobacco. She reports current alcohol use of about 2.0 - 3.0 standard drinks per week. She reports that she does not use drugs.       Review of Systems  Respiratory:  Positive for cough.   All other systems reviewed and are negative. Per HPI Exam Physical Exam Vitals reviewed.  Constitutional:      General: She is not in acute distress. HENT:     Head: Normocephalic.  Cardiovascular:     Rate and Rhythm: Normal rate and regular rhythm.     Heart sounds: Normal heart sounds.  Pulmonary:     Effort: Pulmonary effort is normal.     Breath sounds: Normal breath sounds. No wheezing or rales.  Abdominal:     Palpations:  Abdomen is soft.     Tenderness: There is no abdominal tenderness.  Musculoskeletal:        General: Normal range of motion.  Skin:    General: Skin is warm and dry.  Neurological:     Mental Status: She is alert.     Deep Tendon Reflexes: Reflexes abnormal.     Comments: +3 DTR b/l no clonus  Psychiatric:        Mood and Affect: Mood normal.        Behavior: Behavior normal.      Blood pressure (!) 162/90, pulse 70, temperature 99.6 F (37.6 C), resp. rate 18, SpO2 99 %, unknown if currently breastfeeding.   Chemistry PENDING Hematology Recent Labs  Lab 01/13/21 0418 01/19/21 1805  WBC 12.9* 10.8*  RBC 3.23* 4.13  HGB 10.0* 12.7  HCT 30.1* 38.1  MCV 93.2 92.3   MCH 31.0 30.8  MCHC 33.2 33.3  RDW 14.3 13.6  PLT 127* 309   Cardiac EnzymesNo results for input(s): TROPONINI in the last 168 hours. No results for input(s): TROPIPOC in the last 168 hours.  BNP Recent Labs  Lab 01/19/21 1802  BNP 229.3*    DDimer No results for input(s): DDIMER in the last 168 hours.   Assessment/Plan: Brandi Davies is a 35 y.o. female G2P2002 PPD#7 with severe hypertension  Counseled patient on severe range BP and recommendation for admission for IV Magnesium and BP control. We discussed risks of PEC, although low suspicion at this time given mostly asymptomatic and initial labs within normal limits. Mild HA but likely exacerbated by dehydration and has not yet been treated with pain medications.   -Admit to [facility] speciality care unit -IV treatments delayed in MAU due to difficulty establishing IV access, but will now initiate IV Labetalol protocol for acute BP mgmt. Procardia 30XL daily ordered as well for long term mgmt, titrate as indicated -IV Mag sulfate seizure ppx 4g bolus followed by 2g/hr maintenance, plan for 24hr -Follow up remaining PIH labs, repeat if indicated -Symptomatic COVID positive, normal CXR, B-NP mildly elevated but patient is PP and normal PE with stable vitals, low suspicion for any cardiomyopathy. Will cont to monitor vitals closely. Continue Mucinex and throat lozenges PRN -Patient may continue to BF, lactation support PRN -Continue home Celexa 20 mg daily for anxiety -Routine PP care  DC planning pending BP control  Ofelia Podolski A Charisa Twitty 01/19/2021, 7:41 PM

## 2021-01-19 NOTE — MAU Provider Note (Signed)
History     CSN: [id number]  Arrival date and time: 01/19/21 1556   None     Chief Complaint  Patient presents with   Headache   Hypertension   [name] is a 35 y.o. G2P2002 at postpartum who presents to MAU for preeclampsia evaluation after she had a severe range pressure at home and was sent to MAU for further evaluation. Patient reports she had an appointment yesterday for BP check, but cancelled d/t not feeling well. Patient reports she was diagnosed with COVID on 01/11/2021 and is feeling overall better from COVID other than a cough.  Patient does endorse a headache at this time, and rates it as 4/10. Patient denies taking anything for the headache, but reports she is taking ibuprofen 800mg  and Tylenol 1000mg  every 6 hours per her discharge instructions. Patient has been doing this since she was discharged home on 01/14/2021 and feels like she still needs this combination because when she coughs, the incision hurts significantly. Patient reports she did not fill the prescription for oxycodone.  Patient does endorse mild SOB when moving around doing multiple activities, but otherwise states she does not get SOB and does not endorse SOB while resting in MAU.   Pt denies blurry vision/seeing spots, N/V, epigastric pain, swelling in face and hands, sudden weight gain. Pt denies chest pain.  Pt denies constipation, diarrhea, or urinary problems. Pt denies fever, chills, fatigue, sweating or changes in appetite. Pt denies dizziness, light-headedness, weakness.  Current pregnancy problems? gHTN, hx exercise induced asthma Blood Type? AB Positive Allergies? sulfa Current medications? Ibuprofen/Tylenol (last took about 930AM), Celexa, PNV, Mucinex (last took 930AM today), Colace Current [facility] & next appt? [facility], 01/24/2021   OB History     Gravida  2   Para  2   Term  2   Preterm  0   AB  0   Living  2      SAB  0   IAB  0   Ectopic  0   Multiple  0    Live Births  2           Past Medical History:  Diagnosis Date   Abnormal Pap smear of cervix    Anxiety    Anxiety 05/26/2018   Asthma    Asthma, chronic 05/26/2018    Past Surgical History:  Procedure Laterality Date   CESAREAN SECTION N/A 05/26/2018   Procedure: CESAREAN SECTION;  Surgeon: 05/28/2018, MD;  Location: [facility] BIRTHING SUITES;  Service: Obstetrics;  Laterality: N/A;   CESAREAN SECTION N/A 01/12/2021   Procedure: REPEAT CESAREAN SECTION;  Surgeon: [facility], MD;  Location: [facility] LD ORS;  Service: Obstetrics;  Laterality: N/A;   LEEP  2017   MOUTH SURGERY     WISDOM TOOTH EXTRACTION      Family History  Problem Relation Age of Onset   Stomach cancer Maternal Grandmother    Stomach cancer Maternal Grandfather    Stroke Paternal Grandmother    Heart disease Paternal Grandmother    Heart attack Paternal Grandmother    Heart attack Paternal Grandfather     Social History   Tobacco Use   Smoking status: Never   Smokeless tobacco: Never  Vaping Use   Vaping Use: Never used  Substance Use Topics   Alcohol use: Yes    Alcohol/week: 2.0 - 3.0 standard drinks    Types: 2 - 3 Standard drinks or equivalent per week   Drug use: No  Allergies:  Allergies  Allergen Reactions   Sulfa Antibiotics Swelling    Medications Prior to Admission  Medication Sig Dispense Refill Last Dose   acetaminophen (TYLENOL) 500 MG tablet Take 2 tablets (1,000 mg total) by mouth every 6 (six) hours. 30 tablet 0 01/19/2021   citalopram (CELEXA) 20 MG tablet TAKE 1 TABLET BY MOUTH  DAILY 90 tablet 0 01/19/2021   docusate sodium (COLACE) 100 MG capsule Take 100 mg by mouth 2 (two) times daily.   01/19/2021   guaiFENesin (MUCINEX) 600 MG 12 hr tablet Take 600 mg by mouth 2 (two) times daily.      oxyCODONE (OXY IR/ROXICODONE) 5 MG immediate release tablet Take 1 tablet (5 mg total) by mouth every 6 (six) hours as needed for up to 5 days for moderate pain. 18 tablet 0 01/19/2021    Prenatal Vit-Fe Fumarate-FA (PRENATAL MULTIVITAMIN) TABS tablet Take 2 tablets by mouth daily at 12 noon.   01/19/2021   simethicone (MYLICON) 80 MG chewable tablet Chew 1 tablet (80 mg total) by mouth as needed for flatulence. 30 tablet 0 Past Week   albuterol (PROVENTIL HFA;VENTOLIN HFA) 108 (90 Base) MCG/ACT inhaler Inhale 2 puffs into the lungs every 6 (six) hours as needed for wheezing or shortness of breath. 1 Inhaler 2    coconut oil OIL Apply 1 application topically as needed.  0    ibuprofen (ADVIL) 600 MG tablet Take 1 tablet (600 mg total) by mouth every 6 (six) hours. 30 tablet 0     Review of Systems  Constitutional:  Negative for chills, diaphoresis, fatigue and fever.  Eyes:  Negative for visual disturbance.  Respiratory:  Positive for cough and shortness of breath ((on movement)).   Cardiovascular:  Negative for chest pain.  Gastrointestinal:  Negative for abdominal pain, constipation, diarrhea, nausea and vomiting.  Genitourinary:  Negative for dysuria, flank pain, frequency, pelvic pain, urgency, vaginal bleeding and vaginal discharge.  Neurological:  Positive for headaches. Negative for dizziness, weakness and light-headedness.   Physical Exam   Blood pressure (!) 157/93, pulse 73, temperature 99.6 F (37.6 C), resp. rate 18, SpO2 99 %, unknown if currently breastfeeding.  Patient Vitals for the past 24 hrs:  BP Temp Pulse Resp SpO2  01/19/21 1731 (!) 170/114 -- 72 -- --  01/19/21 1716 (!) 169/103 -- 70 -- --  01/19/21 1701 (!) 165/103 -- 69 -- --  01/19/21 1645 (!) 158/101 -- 65 -- --  01/19/21 1632 (!) 157/93 99.6 F (37.6 C) 73 18 99 %   Physical Exam Vitals and nursing note reviewed.  Constitutional:      General: She is not in acute distress.    Appearance: Normal appearance. She is not ill-appearing, toxic-appearing or diaphoretic.  HENT:     Head: Normocephalic and atraumatic.  Pulmonary:     Effort: Pulmonary effort is normal.  Neurological:      Mental Status: She is alert and oriented to person, place, and time.  Psychiatric:        Mood and Affect: Mood normal.        Behavior: Behavior normal.        Thought Content: Thought content normal.        Judgment: Judgment normal.   No results found for this or any previous visit (from the past 24 hour(s)).  No results found.  MAU Course  Procedures  MDM -preeclampsia evaluation with severe range BP in MAU, preeclampsia protocol given -symptoms include: HA rated 4/10 and  SOB after diagnosis of COVID -magnesium appears to have been ordered for patient during hospital stay for delivery, but does not appear to have been administered -CBC, CMP, BNP, Tropoin labs ordered -EKG, Chest X-Ray ordered -called Dr. Conni Elliot to alert to severe range pressures and diagnosis of severe postpartum preeclampsia and recommend admission. Dr. Conni Elliot agrees with plan for admission and will enter admission orders. Dr. Conni Elliot also alerted to COVID symptoms and SOB work-up being currently performed -admit to [facility]  Orders Placed This Encounter  Procedures   DG Chest 1 View    Standing Status:   Standing    Number of Occurrences:   1    Order Specific Question:   Symptom/Reason for Exam    Answer:   Shortness of breath [786.05.ICD-9-CM]   Comprehensive metabolic panel    Standing Status:   Standing    Number of Occurrences:   1   CBC with Differential    Standing Status:   Standing    Number of Occurrences:   1   Brain natriuretic peptide    Standing Status:   Standing    Number of Occurrences:   1   Notify physician (specify) Confirmatory reading of BP> 160/110 15 minutes later    Confirmatory reading of BP> 160/110 15 minutes later    Standing Status:   Standing    Number of Occurrences:   1    Order Specific Question:   Notify Physician    Answer:   Temp greater than or equal to 100.4    Order Specific Question:   Notify Physician    Answer:   RR greater than 24 or less than 10    Order  Specific Question:   Notify Physician    Answer:   HR greater than 120 or less than 50    Order Specific Question:   Notify Physician    Answer:   SBP greater than 160 mmHG or less than 80 mmHG    Order Specific Question:   Notify Physician    Answer:   DBP greater than 110 mmHG or less than 45 mmHG    Order Specific Question:   Notify Physician    Answer:   Urinary output is less than for any 4 hour period   Measure blood pressure    10 minutes after giving labetalol 40 MG IV dose.  Call MD if SBP >/= 160 or DBP >/= 110.    Standing Status:   Standing    Number of Occurrences:   1   EKG 12-Lead    Standing Status:   Standing    Number of Occurrences:   1   Insert peripheral IV    Standing Status:   Standing    Number of Occurrences:   1   Meds ordered this encounter  Medications   AND Linked Order Group    hydrALAZINE (APRESOLINE) injection 5 mg    hydrALAZINE (APRESOLINE) injection 10 mg    labetalol (NORMODYNE) injection 20 mg    labetalol (NORMODYNE) injection 40 mg   Assessment and Plan   1. Severe pre-eclampsia, postpartum condition or complication   2. Shortness of breath    -admit to [facility]  Odie Sera Key Cen 01/19/2021, 5:03 PM

## 2021-01-19 NOTE — MAU Note (Signed)
Induced for elevated BP.  C/s on 8/5.  Pt was not dc'd on any BP medication.  +HA (doing Advil /Tylenol regimen- helps HA)denies visual changes, epigastric pain    or swelling.

## 2021-01-20 MED ORDER — LABETALOL HCL 200 MG PO TABS
200.0000 mg | ORAL_TABLET | Freq: Two times a day (BID) | ORAL | Status: DC
  Administered 2021-01-20: 200 mg via ORAL
  Filled 2021-01-20: qty 1

## 2021-01-20 MED ORDER — IBUPROFEN 600 MG PO TABS
600.0000 mg | ORAL_TABLET | Freq: Four times a day (QID) | ORAL | Status: DC | PRN
  Administered 2021-01-20 (×2): 600 mg via ORAL
  Filled 2021-01-20 (×2): qty 1

## 2021-01-20 MED ORDER — BUTALBITAL-APAP-CAFFEINE 50-325-40 MG PO TABS
2.0000 | ORAL_TABLET | Freq: Four times a day (QID) | ORAL | Status: DC | PRN
  Administered 2021-01-20 (×2): 2 via ORAL
  Filled 2021-01-20 (×2): qty 2

## 2021-01-20 MED ORDER — NIFEDIPINE ER OSMOTIC RELEASE 30 MG PO TB24
30.0000 mg | ORAL_TABLET | Freq: Once | ORAL | Status: AC
  Administered 2021-01-20: 30 mg via ORAL
  Filled 2021-01-20: qty 1

## 2021-01-20 MED ORDER — NIFEDIPINE ER OSMOTIC RELEASE 60 MG PO TB24
60.0000 mg | ORAL_TABLET | Freq: Every day | ORAL | Status: DC
  Administered 2021-01-21 – 2021-01-22 (×2): 60 mg via ORAL
  Filled 2021-01-20 (×2): qty 1

## 2021-01-20 NOTE — Progress Notes (Signed)
Brandi Davies 35 y.o. [id number] PPD#8 s/p rLTCS HD#2 readmitted with severe range BP  S: Patient is doing ok this morning. She reports continued HA, some improvement after taking Motrin an hour ago, states less throbbing and more generalized. Some fuzzy vision but denies any scotoma. No CP or SOB. Residual cough improving. No RUQ or epigastric pain. No PP complaints. Baby girl is doing well at bedside, breastfeeding. Mother in [place] in room as support person.  O: Vitals:   01/20/21 0500 01/20/21 0600 01/20/21 0700 01/20/21 0805  BP: (!) 153/94 (!) 145/91 (!) 147/88 (!) 148/86  Pulse: 75 74 80 (!) 103  Resp: 17 18 18 17   Temp:    98 F (36.7 C)  TempSrc:    Oral  SpO2:    99%  Weight:      Height:        Intake/Output Summary (Last 24 hours) at 01/20/2021 1027 Last data filed at 01/20/2021 0805 Gross per 24 hour  Intake 2888.45 ml  Output 3700 ml  Net -811.55 ml   CBC    Component Value Date/Time   WBC 10.8 (H) 01/19/2021 1805   RBC 4.13 01/19/2021 1805   HGB 12.7 01/19/2021 1805   HGB 13.9 08/19/2019 0918   HCT 38.1 01/19/2021 1805   HCT 43.2 08/19/2019 0918   PLT 309 01/19/2021 1805   PLT 223 08/19/2019 0918   MCV 92.3 01/19/2021 1805   MCV 97 08/19/2019 0918   MCH 30.8 01/19/2021 1805   MCHC 33.3 01/19/2021 1805   RDW 13.6 01/19/2021 1805   RDW 13.4 08/19/2019 0918   LYMPHSABS 2.4 01/19/2021 1805   MONOABS 0.5 01/19/2021 1805   EOSABS 0.2 01/19/2021 1805   BASOSABS 0.0 01/19/2021 1805   CMP     Component Value Date/Time   NA 135 01/19/2021 1925   NA 143 08/19/2019 0918   K 3.9 01/19/2021 1925   CL 105 01/19/2021 1925   CO2 22 01/19/2021 1925   GLUCOSE 96 01/19/2021 1925   BUN 18 01/19/2021 1925   BUN 16 08/19/2019 0918   CREATININE 0.62 01/19/2021 1925   CALCIUM 8.8 (L) 01/19/2021 1925   PROT 6.7 01/19/2021 1925   PROT 7.0 08/19/2019 0918   ALBUMIN 3.2 (L) 01/19/2021 1925   ALBUMIN 4.6 08/19/2019 0918   AST 23 01/19/2021 1925   ALT 38 01/19/2021 1925    ALKPHOS 57 01/19/2021 1925   BILITOT 0.2 (L) 01/19/2021 1925   BILITOT 0.5 08/19/2019 0918   GFRNONAA >60 01/19/2021 1925   GFRAA 134 08/19/2019 0918   Physical Exam: General: AAO, NAD Cardiovascular: RRR Respiratory: normal effort Extremities: no edema  A/P: Brandi Davies 35 y.o. [name] PPD#8 s/p rLTCS complicated by gHTN symptomatic COVID now HD#2 readmitted with severe range BP's, now clinically stable  Severe HTN -BP improved overnight, no severe ranges or requiring additional IV pushes. BP range from 140-150s/80-90's s/p initial Procardia 30XL on 8/12 @ 2300 and received AM dose on 8/13 @ 0900. Plan for additional 30 Procardia and change to 60 daily for tomorrow. Monitor BP and adjust regimen as indicated -Magnesium sulfate seizure ppx x24hr, dc tonight 2245 -S/p normal PIH labs on admission -Treat HA with Ibuprofen PRN and change Tylenol to Fioricet 2 tabs q 6hr PRN, likely exacerbated by Mag as well, will cont to monitor -Normal diuresis cont to monitor I/O 2. Symptomatic COVID -Clinically improving and VSS, s/p normal CXR and EKG, cont supportive measures and Mucinex PRN 3. Anxiety -Continue Celexa 20 mg  daily 4. Routine postpartum care Will continue inpatient management overnight and plan for discharge home tomorrow pending adequate BP control   Ndia Sampath A Jann Ra 01/20/21 10:24 AM

## 2021-01-21 MED ORDER — LABETALOL HCL 200 MG PO TABS
400.0000 mg | ORAL_TABLET | Freq: Two times a day (BID) | ORAL | Status: DC
  Administered 2021-01-21: 400 mg via ORAL
  Filled 2021-01-21: qty 2

## 2021-01-21 MED ORDER — LABETALOL HCL 200 MG PO TABS
400.0000 mg | ORAL_TABLET | Freq: Three times a day (TID) | ORAL | Status: DC
  Administered 2021-01-21 – 2021-01-22 (×2): 400 mg via ORAL
  Filled 2021-01-21 (×2): qty 2

## 2021-01-21 NOTE — Progress Notes (Signed)
Pt 10 days out from + covid test. Precautions d/c'd per protocol. Family educated.

## 2021-01-21 NOTE — Progress Notes (Signed)
S: pt feeling well, no HA, no scotomata, no vision change, no RUQ pain. Some increase in vaginal bleeding as more active. No dizziness.  O: Vitals:   01/21/21 1627 01/21/21 1835 01/21/21 2030 01/21/21 2042  BP: 136/79 133/85 (!) 151/97 (!) 150/88  Pulse: 79 76 87 79  Resp: 17     Temp: 98.3 F (36.8 C)  98.7 F (37.1 C)   TempSrc:   Oral   SpO2: 98%     Weight:      Height:       A/P: PP PEC, bp under better control today with procardia 400mg  and Procardia 60XL however bps elevated now 10 hrs after last labetalol, will switch to q8hrs and expect bps to stay in better range. Anticipate d/c in am.   01/21/2021 8:51 PM

## 2021-01-21 NOTE — Progress Notes (Signed)
Brandi Davies 35 y.o. [id number] PPD#9 s/p rLTCS HD#3 readmitted with severe range BP  S: Patient is feeling much better this morning. States her HA and blurred vision completely resolved after the Magnesium was turned off last night. No symptoms this morning. Denies any CP, SOB, RUQ or epigastric pain. Came off isolation precautions today now 10 days out from COVID, symptomatically improving. Mucinex is helping. Baby girl is doing well at bedside, breastfeeding.  O: Vitals:   01/21/21 0337 01/21/21 0637 01/21/21 0815 01/21/21 1145  BP: (!) 146/89 (!) 145/98 (!) 142/89 124/74  Pulse: 80 82 74 73  Resp: 16 18 17 16   Temp: 98.4 F (36.9 C)  97.9 F (36.6 C) 98.4 F (36.9 C)  TempSrc: Oral  Oral   SpO2: 98% 100%  96%  Weight:      Height:       Physical Exam: General: AAO, NAD Cardiovascular: RRR Respiratory: normal effort Extremities: no edema  Brandi/P: Brandi Davies 35 y.o. Brandi Davies PPD#9 s/p rLTCS c/b gHTN and symptomatic COVID, now HD#3 readmitted with severe range BPs, now clinically stable   Severe HTN -S/p 24hr Mag sulfate therapy, off since 2300 last night -BP persistently 140-150s/90s yesterday, added on Labetalol 200 BID- continued elevated overnight but no severe ranges- Labetalol inc to 400 BID this morning and received Procardia 60XL as started yesterday- BP now 124/74. Will need to monitor BP's throughout the day to determine if patient can go home on this regimen. She is aware if further BP medication titration is needed, she will need to stay inpatient for continued monitoring -S/p normal PIH labs on admission -Asymptomatic 2. Symptomatic COVID now off precautions -Clinically improving and VSS, s/p normal CXR and EKG, cont supportive measures and Mucinex PRN 3. Anxiety -Continue Celexa 20 mg daily 4. Routine postpartum care  DC planning pending BP control  Brandi Davies Brandi Davies 01/21/21 12:53 PM

## 2021-01-21 NOTE — Progress Notes (Signed)
Pt not on isolation and may have 2 visitors.

## 2021-01-21 NOTE — Plan of Care (Signed)
BP 142/89 this AM. Continuing to monitor BP with new medication admin instructions.   Problem: Education: Goal: Knowledge of General Education information will improve Description: Including pain rating scale, medication(s)/side effects and non-pharmacologic comfort measures Outcome: Progressing   Problem: Health Behavior/Discharge Planning: Goal: Ability to manage health-related needs will improve Outcome: Progressing   Problem: Clinical Measurements: Goal: Ability to maintain clinical measurements within normal limits will improve Outcome: Progressing Goal: Will remain free from infection Outcome: Progressing Goal: Diagnostic test results will improve Outcome: Progressing Goal: Respiratory complications will improve Outcome: Progressing Goal: Cardiovascular complication will be avoided Outcome: Progressing   Problem: Activity: Goal: Risk for activity intolerance will decrease Outcome: Progressing   Problem: Nutrition: Goal: Adequate nutrition will be maintained Outcome: Progressing   Problem: Coping: Goal: Level of anxiety will decrease Outcome: Progressing   Problem: Elimination: Goal: Will not experience complications related to bowel motility Outcome: Progressing Goal: Will not experience complications related to urinary retention Outcome: Progressing   Problem: Pain Managment: Goal: General experience of comfort will improve Outcome: Progressing   Problem: Safety: Goal: Ability to remain free from injury will improve Outcome: Progressing   Problem: Skin Integrity: Goal: Risk for impaired skin integrity will decrease Outcome: Progressing   Problem: Education: Goal: Knowledge of disease or condition will improve Outcome: Progressing Goal: Knowledge of the prescribed therapeutic regimen will improve Outcome: Progressing   Problem: Fluid Volume: Goal: Peripheral tissue perfusion will improve Outcome: Progressing   Problem: Clinical  Measurements: Goal: Complications related to disease process, condition or treatment will be avoided or minimized Outcome: Progressing

## 2021-01-22 MED ORDER — LABETALOL HCL 200 MG PO TABS: 400.0000 mg | ORAL_TABLET | Freq: Three times a day (TID) | ORAL | 1 refills | Status: DC

## 2021-01-22 MED ORDER — NIFEDIPINE ER 60 MG PO TB24: 60.0000 mg | ORAL_TABLET | Freq: Every day | ORAL | 1 refills | Status: DC

## 2021-01-22 NOTE — Progress Notes (Signed)
Patient provided discharge information per provider and verbalized understanding of PPH and preeclampsia signs and symptoms. RN reviewed medication administration information and when to call the provider.

## 2021-01-22 NOTE — Discharge Summary (Signed)
Brandi Davies MRN: [id number] DOB/AGE: 33-04-1986 33 y.o.  Admit date: 01/19/2021 Discharge date: 01/22/21  Admission Diagnoses: Hypertension in pregnancy, pre-eclampsia, severe, postpartum condition [O14.15]  Discharge Diagnoses: Hypertension in pregnancy, pre-eclampsia, severe, postpartum condition [O14.15]        Active Problems:   Hypertension in pregnancy, pre-eclampsia, severe, postpartum condition   Discharged Condition: stable  Hospital Course: Patient admitted 7 days postpartum from repeat cesarean section with severe range blood pressures and headache.  She was started on magnesium sulfate which continued for 24 hours.  Her labs did not show any evidence of preeclampsia.  She also noted mild shortness of breath and was known to be 7 days out from a positive COVID test.  She had a normal chest x-ray and a mildly elevated BNP.  She was started on Procardia for blood pressure control.  Blood pressures remain slightly elevated and she was slowly increased from Procardia 30 XL to 60 XL and then labetalol started at 200 twice daily and increased to 400 3 times daily by the time of discharge.  On day of discharge patient was feeling much better with no further headache.  She does endorse anxiety when nurses are coming to take her blood pressure.  She is treated for anxiety with Celexa.  She reports no scotomata no chest pain, no dizziness and is ambulating without difficulty.  She notes good postoperative pain control taking only occasional ibuprofen.  She is breast-feeding.  She reports minimal lochia and no fevers.  On exam Vitals:   01/22/21 0400 01/22/21 0617 01/22/21 0835 01/22/21 1032  BP: 137/76 (!) 152/93 135/84 (!) 144/87  Pulse: 70 66 80 69  Resp: 16  18   Temp: 98.4 F (36.9 C)  98 F (36.7 C)   TempSrc: Oral  Oral   SpO2: 96%  97%   Weight:      Height:       CV RRR Pulm: CTAB ABd; soft, nontender, no right upper quadrant pain GU: Deferred Lower extremity: Nontender, no  edema, 3+ DTR, no clonus  Consults: None  Treatments: Magnesium and antihypertensives  Disposition: Discharge disposition: 01-Home or Self Care      Patient has follow-up visit in office in 2 days time.  She will check blood pressures twice a day and call for any abnormal blood pressures.  Allergies as of 01/22/2021       Reactions   Sulfa Antibiotics Swelling        Medication List     STOP taking these medications    oxyCODONE 5 MG immediate release tablet Commonly known as: Oxy IR/ROXICODONE       TAKE these medications    acetaminophen 500 MG tablet Commonly known as: TYLENOL Take 2 tablets (1,000 mg total) by mouth every 6 (six) hours.   albuterol 108 (90 Base) MCG/ACT inhaler Commonly known as: VENTOLIN HFA Inhale 2 puffs into the lungs every 6 (six) hours as needed for wheezing or shortness of breath.   citalopram 20 MG tablet Commonly known as: CELEXA TAKE 1 TABLET BY MOUTH  DAILY   coconut oil Oil Apply 1 application topically as needed.   docusate sodium 100 MG capsule Commonly known as: COLACE Take 100 mg by mouth 2 (two) times daily.   guaiFENesin 600 MG 12 hr tablet Commonly known as: MUCINEX Take 600 mg by mouth 2 (two) times daily.   ibuprofen 600 MG tablet Commonly known as: ADVIL Take 1 tablet (600 mg total) by mouth every 6 (six)  hours.   labetalol 200 MG tablet Commonly known as: NORMODYNE Take 2 tablets (400 mg total) by mouth every 8 (eight) hours.   NIFEdipine 60 MG 24 hr tablet Commonly known as: ADALAT CC Take 1 tablet (60 mg total) by mouth daily. Start taking on: January 23, 2021   prenatal multivitamin Tabs tablet Take 2 tablets by mouth daily at 12 noon.   simethicone 80 MG chewable tablet Commonly known as: MYLICON Chew 1 tablet (80 mg total) by mouth as needed for flatulence.         Signed: Lendon Colonel, MD 01/22/2021, 12:54 PM

## 2021-01-22 NOTE — Plan of Care (Signed)
  Problem: Education: °Goal: Knowledge of General Education information will improve °Description: Including pain rating scale, medication(s)/side effects and non-pharmacologic comfort measures °Outcome: Completed/Met °  °Problem: Health Behavior/Discharge Planning: °Goal: Ability to manage health-related needs will improve °Outcome: Completed/Met °  °Problem: Clinical Measurements: °Goal: Ability to maintain clinical measurements within normal limits will improve °Outcome: Completed/Met °Goal: Will remain free from infection °Outcome: Completed/Met °Goal: Diagnostic test results will improve °Outcome: Completed/Met °Goal: Respiratory complications will improve °Outcome: Completed/Met °Goal: Cardiovascular complication will be avoided °Outcome: Completed/Met °  °Problem: Activity: °Goal: Risk for activity intolerance will decrease °Outcome: Completed/Met °  °Problem: Nutrition: °Goal: Adequate nutrition will be maintained °Outcome: Completed/Met °  °Problem: Coping: °Goal: Level of anxiety will decrease °Outcome: Completed/Met °  °Problem: Elimination: °Goal: Will not experience complications related to bowel motility °Outcome: Completed/Met °Goal: Will not experience complications related to urinary retention °Outcome: Completed/Met °  °Problem: Pain Managment: °Goal: General experience of comfort will improve °Outcome: Completed/Met °  °Problem: Safety: °Goal: Ability to remain free from injury will improve °Outcome: Completed/Met °  °Problem: Skin Integrity: °Goal: Risk for impaired skin integrity will decrease °Outcome: Completed/Met °  °Problem: Education: °Goal: Knowledge of disease or condition will improve °Outcome: Completed/Met °Goal: Knowledge of the prescribed therapeutic regimen will improve °Outcome: Completed/Met °  °Problem: Fluid Volume: °Goal: Peripheral tissue perfusion will improve °Outcome: Completed/Met °  °Problem: Clinical Measurements: °Goal: Complications related to disease process,  condition or treatment will be avoided or minimized °Outcome: Completed/Met °  °

## 2022-09-19 IMAGING — DX DG CHEST 1V
1 series · 1 of 1 positions shown · non-contrast
Comparison: None.

CLINICAL DATA: Shortness of breath

EXAM:
CHEST  1 VIEW

[chest]
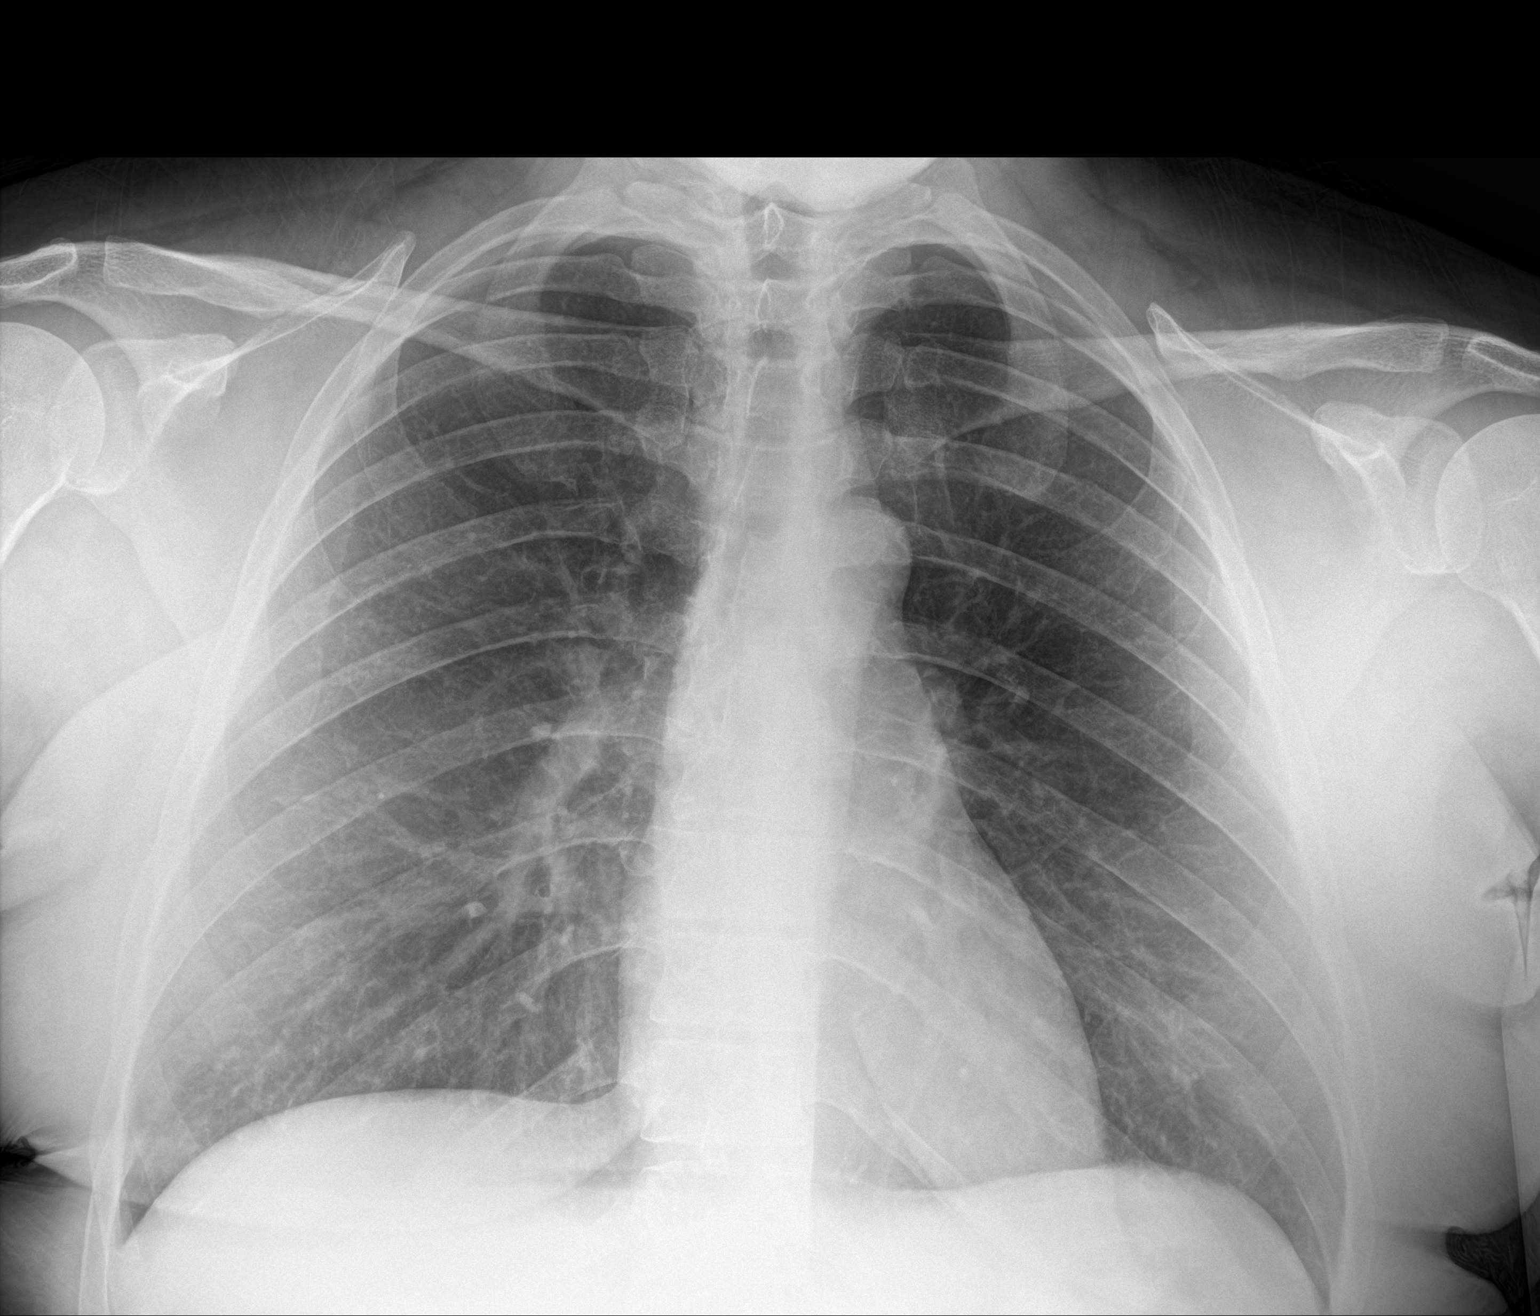

[1 of 1 positions shown; findings below may reference images not displayed]

FINDINGS: The heart size and mediastinal contours are within normal limits.
Both lungs are clear. The visualized skeletal structures are
unremarkable.
IMPRESSION: No active disease.

## 2023-02-04 DIAGNOSIS — F410 Panic disorder [episodic paroxysmal anxiety] without agoraphobia: Secondary | ICD-10-CM | POA: Insufficient documentation

## 2023-02-04 DIAGNOSIS — F332 Major depressive disorder, recurrent severe without psychotic features: Secondary | ICD-10-CM | POA: Insufficient documentation

## 2023-02-04 DIAGNOSIS — F1010 Alcohol abuse, uncomplicated: Secondary | ICD-10-CM | POA: Insufficient documentation

## 2023-02-04 DIAGNOSIS — F411 Generalized anxiety disorder: Secondary | ICD-10-CM | POA: Insufficient documentation

## 2023-02-19 DIAGNOSIS — F900 Attention-deficit hyperactivity disorder, predominantly inattentive type: Secondary | ICD-10-CM | POA: Insufficient documentation

## 2023-07-07 ENCOUNTER — Ambulatory Visit (INDEPENDENT_AMBULATORY_CARE_PROVIDER_SITE_OTHER): Payer: 59 | Admitting: Family Medicine

## 2023-07-07 ENCOUNTER — Encounter (INDEPENDENT_AMBULATORY_CARE_PROVIDER_SITE_OTHER): Payer: Self-pay | Admitting: Family Medicine

## 2023-07-07 VITALS — BP 131/86 | HR 60 | Temp 98.1°F | Ht 67.0 in | Wt 261.0 lb

## 2023-07-07 DIAGNOSIS — E669 Obesity, unspecified: Secondary | ICD-10-CM

## 2023-07-07 DIAGNOSIS — F1090 Alcohol use, unspecified, uncomplicated: Secondary | ICD-10-CM

## 2023-07-07 DIAGNOSIS — R7303 Prediabetes: Secondary | ICD-10-CM

## 2023-07-07 DIAGNOSIS — Z6841 Body Mass Index (BMI) 40.0 and over, adult: Secondary | ICD-10-CM | POA: Diagnosis not present

## 2023-07-07 DIAGNOSIS — Z0289 Encounter for other administrative examinations: Secondary | ICD-10-CM

## 2023-07-07 NOTE — Progress Notes (Signed)
.smr  Office: [phone]  /  Fax: [phone]  WEIGHT SUMMARY AND BIOMETRICS  Anthropometric Measurements Height: 5\' 7"  (1.702 m) Weight: 261 lb (118.4 kg) BMI (Calculated): 40.87 Weight at Last Visit: N/A Weight Lost Since Last Visit: N/A Weight Gained Since Last Visit: N/A Starting Weight: N/A Peak Weight: N/A   Body Composition  Body Fat %: 47 % Fat Mass (lbs): 123 lbs Muscle Mass (lbs): 131.8 lbs Total Body Water (lbs): 97 lbs Visceral Fat Rating : 12   Other Clinical Data Fasting: No Labs: No Today's Visit #: INFO SESSION Comments: INFORMATION SESSION    Chief Complaint: OBESITY   Discussed the use of AI scribe software for clinical note transcription with the patient, who gave verbal consent to proceed.  History of Present Illness   The patient, a 38 year old individual with a history of obesity, presents for a consultation regarding weight management. The patient's current weight is 261 lbs, with a BMI of 41, a visceral fat rating of 12, and a body fat percentage of 47.0. The patient reports a lifelong struggle with weight, which she attributes at least in part to a negative relationship with food stemming from childhood experiences.  The patient acknowledges a psychological barrier to healthy eating, often resorting to comfort food despite understanding the importance of a balanced diet. She reports recent difficulty with eating, often experiencing a lack of appetite. The patient also notes a significant weight gain over the past five years, which she attributes to the birth of her two children, the onset of the COVID-19 pandemic, and the stress of living with her husband's grandmother.  The patient also reports a history of alcohol use as a coping mechanism, which she is actively addressing with medication management, including naltrexone and Antabuse. She expresses a desire to avoid falling into patterns of alcoholism, as seen in her family history.  The  patient's recent blood work revealed prediabetes, which has prompted her to seek intervention for her weight issues. She expresses a readiness to make lifestyle changes and a desire for a personalized guidebook to help her navigate her weight loss journey.  The patient also mentions a history of preeclampsia, triggered by a COVID-19 infection at 39 weeks of pregnancy, leading to an emergency C-section. This event, coupled with the stress of her husband's COVID-19 diagnosis and the subsequent hospital restrictions, was described as traumatic.  The patient is currently a parent to two young children and works for the [employer]. She expresses a desire to return to an active lifestyle, which she enjoyed before having children. The patient is seeking guidance on how to incorporate healthy eating and exercise into her daily routine, with the ultimate goal of achieving a healthier weight and preventing the progression of prediabetes.          PHYSICAL EXAM:  Blood pressure 131/86, pulse 60, temperature 98.1 F (36.7 C), height 5\' 7"  (1.702 m), weight 261 lb (118.4 kg), SpO2 100%, unknown if currently breastfeeding. Body mass index is 40.88 kg/m.  DIAGNOSTIC DATA REVIEWED:  BMET    Component Value Date/Time   NA 135 01/19/2021 1925   NA 143 08/19/2019 0918   K 3.9 01/19/2021 1925   CL 105 01/19/2021 1925   CO2 22 01/19/2021 1925   GLUCOSE 96 01/19/2021 1925   BUN 18 01/19/2021 1925   BUN 16 08/19/2019 0918   CREATININE 0.62 01/19/2021 1925   CALCIUM 8.8 (L) 01/19/2021 1925   GFRNONAA >60 01/19/2021 1925   GFRAA 134 08/19/2019  1610   No results found for: "HGBA1C" No results found for: "INSULIN" No results found for: "TSH" CBC    Component Value Date/Time   WBC 10.8 (H) 01/19/2021 1805   RBC 4.13 01/19/2021 1805   HGB 12.7 01/19/2021 1805   HGB 13.9 08/19/2019 0918   HCT 38.1 01/19/2021 1805   HCT 43.2 08/19/2019 0918   PLT 309 01/19/2021 1805   PLT 223 08/19/2019 0918    MCV 92.3 01/19/2021 1805   MCV 97 08/19/2019 0918   MCH 30.8 01/19/2021 1805   MCHC 33.3 01/19/2021 1805   RDW 13.6 01/19/2021 1805   RDW 13.4 08/19/2019 0918   Iron Studies No results found for: "IRON", "TIBC", "FERRITIN", "IRONPCTSAT" Lipid Panel     Component Value Date/Time   CHOL 179 08/19/2019 0918   TRIG 82 08/19/2019 0918   HDL 59 08/19/2019 0918   CHOLHDL 3.0 08/19/2019 0918   LDLCALC 105 (H) 08/19/2019 0918   Hepatic Function Panel     Component Value Date/Time   PROT 6.7 01/19/2021 1925   PROT 7.0 08/19/2019 0918   ALBUMIN 3.2 (L) 01/19/2021 1925   ALBUMIN 4.6 08/19/2019 0918   AST 23 01/19/2021 1925   ALT 38 01/19/2021 1925   ALKPHOS 57 01/19/2021 1925   BILITOT 0.2 (L) 01/19/2021 1925   BILITOT 0.5 08/19/2019 0918   No results found for: "TSH" Nutritional No results found for: "VD25OH"   Assessment and Plan    Obesity Obesity with a BMI of 41, body weight of 261 lbs, visceral fat rating of 12, and body fat percentage of 47.0%. Weight struggles exacerbated by psychological factors, including family dynamics and recent stressors. Motivated to make changes. Discussed genetic factors and mechanisms the body uses to resist weight loss. Emphasized the importance of a supportive environment and family involvement. Need for metabolism test and comprehensive workup to tailor treatment. - Schedule comprehensive testing including metabolism test and labs - Provide new patient paperwork for next visit - Advise 8-hour fasting before next appointment - Involve family members in visits - Plan bi-weekly visits for the first three months, then adjust frequency based on progress - Provide nutritional counseling and tailored meal plans - Introduce exercise regimen after initial weight loss and nutritional stabilization  Prediabetes Recent blood work indicates prediabetes. Early intervention is crucial to prevent progression to diabetes and reduce the body's defense  mechanisms against weight loss. - Include prediabetes management in comprehensive testing and workup - Monitor blood glucose levels and adjust treatment plan accordingly  Alcohol Use Disorder Uses alcohol as a coping mechanism for stress. Currently on naltrexone and prescribed Antabuse for high-stress moments. Discussed difficulty in obtaining Antabuse and need for additional support in managing stress and avoiding alcohol. - Continue naltrexone as prescribed - Provide support and counseling for stress management and alcohol avoidance -Will be mindful of stress eating behaviors often seen in conjunction with other stress coping substance use disorders  General Health Maintenance Does not have a primary care physician. Interested in setting a healthy example for her children. Discussed focusing on health and strength rather than weight loss when talking to children. - Assist in finding a reputable primary care physician - Encourage healthy eating habits and lifestyle changes for the entire family - Advise focusing on health and strength rather than weight loss with children as pt is already doing  Follow-up - Schedule first two visits for comprehensive testing and initial management - Plan bi-weekly visits for the first three months, then adjust  frequency based on progress - Ensure timely follow-up to monitor progress and adjust treatment plans as needed.         I have personally spent 45 minutes total time today in preparation, patient care, and documentation for this visit, including the following: review of clinical lab tests; review of medical tests/procedures/services.    She was informed of the importance of frequent follow up visits to maximize her success with intensive lifestyle modifications for her multiple health conditions.    Quillian Quince, MD

## 2023-08-05 ENCOUNTER — Encounter (INDEPENDENT_AMBULATORY_CARE_PROVIDER_SITE_OTHER): Payer: Self-pay | Admitting: Family Medicine

## 2023-08-05 ENCOUNTER — Ambulatory Visit (INDEPENDENT_AMBULATORY_CARE_PROVIDER_SITE_OTHER): Payer: 59 | Admitting: Family Medicine

## 2023-08-05 VITALS — BP 126/86 | HR 76 | Temp 97.6°F | Ht 67.0 in | Wt 260.0 lb

## 2023-08-05 DIAGNOSIS — E669 Obesity, unspecified: Secondary | ICD-10-CM | POA: Diagnosis not present

## 2023-08-05 DIAGNOSIS — R0602 Shortness of breath: Secondary | ICD-10-CM | POA: Diagnosis not present

## 2023-08-05 DIAGNOSIS — F1090 Alcohol use, unspecified, uncomplicated: Secondary | ICD-10-CM | POA: Diagnosis not present

## 2023-08-05 DIAGNOSIS — F39 Unspecified mood [affective] disorder: Secondary | ICD-10-CM

## 2023-08-05 DIAGNOSIS — R7303 Prediabetes: Secondary | ICD-10-CM | POA: Diagnosis not present

## 2023-08-05 DIAGNOSIS — Z9189 Other specified personal risk factors, not elsewhere classified: Secondary | ICD-10-CM | POA: Diagnosis not present

## 2023-08-05 DIAGNOSIS — F5089 Other specified eating disorder: Secondary | ICD-10-CM

## 2023-08-05 DIAGNOSIS — Z1331 Encounter for screening for depression: Secondary | ICD-10-CM

## 2023-08-05 DIAGNOSIS — O139 Gestational [pregnancy-induced] hypertension without significant proteinuria, unspecified trimester: Secondary | ICD-10-CM

## 2023-08-05 DIAGNOSIS — R5383 Other fatigue: Secondary | ICD-10-CM

## 2023-08-05 DIAGNOSIS — Z6841 Body Mass Index (BMI) 40.0 and over, adult: Secondary | ICD-10-CM

## 2023-08-05 NOTE — Progress Notes (Signed)
 Brandi Davies, D.O.  ABFM, ABOM Specializing in Clinical Bariatric Medicine Office located at: [address] [place], [place]  [address]   Bariatric Medicine Visit  Dear Brandi Davies   Thank you for referring Brandi Davies to our clinic today for evaluation.  We performed a consultation to discuss her options for treatment and educate the patient on her disease state.  The following note includes my evaluation and treatment recommendations.   Please do not hesitate to reach out to me directly if you have any further concerns.   Assessment and Plan:   FOR THE DISEASE OF OBESITY: BMI 40.0-44.9, adult (HCC) Obesity Beginning BMI 40.87 Assessment & Plan: Brandi Davies is currently in the action stage of change. As such, her goal is to start our weight management plan.  She has agreed to:  start the modified CAT 3 MP with L options and only 100 snack calories   Behavioral Intervention We discussed the following Behavioral Modification Strategies today: begin to work on maintaining a reduced calorie state, getting the recommended amount of protein, incorporating whole foods, making healthy choices, staying well hydrated and practicing mindfulness when eating.  Additional resources provided today: handout on CAT 3 meal plan and handout on non-starchy vegetables  Evidence-based interventions for health behavior change were utilized today including the discussion of self monitoring techniques, problem-solving barriers and SMART goal setting techniques.    Pt will specifically work on: n/a   Recommended Physical Activity Goals Brandi Davies has been advised to work up to 150 minutes of moderate intensity aerobic activity a week and strengthening exercises 2-3 times per week for cardiovascular health, weight loss maintenance and preservation of muscle mass.   She has agreed to : maintain current level of activity.    Pharmacotherapy We both agreed to : begin with nutritional and  behavioral strategies   FOR ASSOCIATED CONDITIONS ADDRESSED TODAY:  Fatigue Assessment & Plan: Brandi Davies does feel that her weight is causing her energy to be lower than it should be. Fatigue may be related to obesity, depression or many other causes. she does not appear to have any red flag symptoms and this appears to most likely be related to her current lifestyle habits and dietary intake.  Labs will be ordered and reviewed with her at their next office visit in two weeks.  Epworth sleepiness scale is 9. Brandi Davies generally gets about 6-8  hours of sleep per night. Brandi Davies admits to some daytime somnolence and admits to waking up still tired. She attributes this to staying up at night because of her young children or is reading a book. Patient has a history of symptoms of  morning headaches (1-2 per week). Snoring is not present. Apneic episodes are not present.   Overall, pt does not feel she has any issues with sleep apnea. Discussed proper sleep hygiene practices and encouraged her to aim for 7-9 hrs of sleep per night for the best weight loss results and for her overall health/well being.   ECG: Performed and reviewed/ interpreted independently.  Normal sinus rhythm, rate 66 bpm; reassuring without any acute abnormalities, will continue to monitor for symptoms    Shortness of breath on exertion Assessment & Plan: Brandi Davies does feel that she gets out of breath more easily than she used to when she exercises and seems to be worsening over time with weight gain.  This has gotten worse recently. Brandi Davies denies shortness of breath at rest or orthopnea. Brandi Davies's shortness of breath appears  to be obesity related and exercise induced, as they do not appear to have any "red flag" symptoms/ concerns today.  Also, this condition appears to be related to a state of poor cardiovascular conditioning   Obtain labs today and will be reviewed with her at their next office visit in two weeks.  Indirect Calorimeter  completed today to help guide our dietary regimen. It shows a VO2 of 301  and a REE of 2074.  Her calculated basal metabolic rate is 4098 thus her resting energy expenditure is better than expected.  Patient agreed to work on weight loss at this time.  As Brandi Davies progresses through our weight loss program, we will gradually increase exercise as tolerated to treat her current condition.   If Brandi Davies follows our recommendations and loses 5-10% of their weight without improvement of her shortness of breath or if at any time, symptoms become more concerning, they agree to urgently follow up with their PCP/ specialist for further consideration/ evaluation.   Brandi Davies verbalizes agreement with this plan.    Depression Screen  Mood disorder (HCC) - emotional eating Assessment & Plan: H/o bulimia (was never formally diagnosed) and ADD (not on any medications). Endorses having 4-5 episodes/week of binging and purging per week over a 3 year period in her late teens - early 60s. Brandi longer an active issue. Today, her food and Mood (modified PHQ-9) score was positive 17. Pt denies SI/HI and feels that her mood is well controlled.  She sees Production designer, theatre/television/film PA-C of [employer] [facility] who manages her medications. Her medication regiment includes Buspar 30 mg daily, Viibryd 40 mg daily, & Hydroxyzine 25 mg prn. Additionally, pt has therapist through [facility] that she sees every 2 weeks. Emotional eating wise, pt tends to eat to help comfort herself.   Continue medication regimen. Encouraged pt to discuss emotional eating strategies with her current therapist. Begin implementation of medically supervised weight management plan    Alcohol use disorder Assessment & Plan: Relevant medications: Naltrexone 50 mg daily & Antabuse prn. She has 2 glasses of wine per d   Feels that condition is under excellent control.  Naltrexone - daily for alcohol (naltrexone to decrease cravings).  And antabuse prn     2 glasses  of wine per night to unwind at night.   I recommend she go to 1 glass a day and stay within her 100 snack calories.     Prediabetes Assessment & Plan: Pt endorses being diagnosed with pre-dm in December 2024 by her GYN. I cannot see her most recent HbA1c in her chart. Denies having issues with her blood sugars during both pregnancies. Has never been on pre-dm medications. Begin nutritional behavioral strategies.    Gestational hypertension, antepartum Assessment & Plan:  Late in her 3rd trimerster of her second pregencyay  pt develpoed elevated blood pressures. Tjey were so high they had to do emergency c-section. After post-partum pt developed pre-eclampisa.    Blood pressure in pre-hypertension range today, above normal.   Monitor her blood pressure, heat healhy, start pogram, eat low salt.   Got covid at 39 weeks with second child  Within couple days had emergency     BP higher than antcipated - in the prehypertensive range. 120/80 or less: goal. Start low sodium diet.    At risk for hypertension  D/t history for gestional hypertension     Puts her at super high risk indepdent of being obese.   8 minutes spent counseling Extra  counseling was done with pt how independent of her weight, having a hx of pre-eclampsia & gestational hypertension    puts her at a significantly higher risk for heart disease, stroke, hypertension, diabetes.   I emphasized the importance of minimizing risks through healthy eating, exercise, and greater cardiovascular fitness.       Due to Brandi Davies's current state of health, lifestyle habits and medical condition(s), she is at a higher risk for developing hypertension.    At least *** minutes was spent on counseling Brandi Davies about these concerns today.  I stressed the importance of reversing risks factors for hypertension which include lowering her BMI.  I discussed there are genetic and family factors which cannot be changed, and I encouraged  Brandi Davies to focus on lifestyle choices that can be modified such as engaging in a healthy diet which includes lowering salt intake, increasing activity, limiting alcohol and/or tobacco intake.  Counseling: Intensive lifestyle modifications discussed with Brandi Davies as most appropriate first line treatment.  she will continue to work on diet, exercise and weight loss efforts.  We will continue to reassess these conditions on a fairly regular basis in an attempt to decrease patient's overall morbidity and mortality.    FOLLOW UP:   Follow up in 2 weeks. She was informed of the importance of frequent follow up visits to maximize her success with intensive lifestyle modifications for her multiple health conditions.  Brandi Davies is aware that we will review all of her lab results at our next visit.  She is aware that if anything is critical/ life threatening with the results, we will be contacting her via MyChart prior to the office visit to discuss management.    Chief Complaint:   OBESITY Brandi Davies (MR# [id number]) is a very pleasant 38 y.o. female who presents for evaluation and treatment of obesity and related comorbidities. Current BMI is Body mass index is 40.72 kg/m. Brandi Davies has been struggling with her weight for many years and has been unsuccessful in either losing weight, maintaining weight loss, or reaching her healthy weight goal.  Dayleen Beske is currently in the action stage of change and ready to dedicate time achieving and maintaining a healthier weight. Brandi Davies is interested in becoming our patient and working on intensive lifestyle modifications including (but not limited to) diet and exercise for weight loss.  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 Patient is married to [place]  and has a 2 y.o daughter and a 38 y.o daughter. She lives with her husband, 2 daughters, and 31 y.o grandmother.   Heaviest weight is current weight -  started gaining excess weight after birth of second daughter.   Desires to be 160 lbs at some point.   Has tried "LA weight loss, starving her self & Bulimia" - none of these worked for her.   Eats out 0-1 time per week.   Craves sugar and comfort foods.  Tends to snack on cheese & crackers, chips & salsa, apples & peanut butter, & protein bars - she denies snaking frequently between meals and after dinner.   She states that her family makes eating healthy difficult: Husband and daughters are picky eaters.   Drinks 2 glasses of wine per night,  sweet tea with honey, protein shakes, and coffee.   Worst food habit: "forgetting to eat and then reaching for easy foods"   Subjective:   This is the patient's first  visit at [facility].  The patient's NEW PATIENT PACKET that they filled out prior to today's office visit was reviewed at length and information from that paperwork was included within the following office visit note.    Included in the packet: current and past health history, medications, allergies, ROS, gynecologic history (women only), surgical history, family history, social history, weight history, weight loss surgery history (for those that have had weight loss surgery), nutritional evaluation, mood and food questionnaire along with a depression screening (PHQ9) on all patients, an Epworth questionnaire, sleep habits questionnaire, patient life and health improvement goals questionnaire. These will all be scanned into the patient's chart under the "media" tab.   Review of Systems: Please refer to new patient packet scanned into media. Pertinent positives were addressed with patient today.  Reviewed by clinician on day of visit: allergies, medications, problem list, medical history, surgical history, family history, social history, and previous encounter notes.  During the visit, I independently reviewed the patient's EKG, bioimpedance scale results, and  indirect calorimeter results. I used this information to tailor a meal plan for the patient that will help Brandi Davies to lose weight and will improve her obesity-related conditions going forward.  I performed a medically necessary appropriate examination and/or evaluation. I discussed the assessment and treatment plan with the patient. The patient was provided an opportunity to ask questions and all were answered. The patient agreed with the plan and demonstrated an understanding of the instructions. Labs were ordered today (unless patient declined them) and will be reviewed with the patient at our next visit unless more critical results need to be addressed immediately. Clinical information was updated and documented in the EMR.    Objective:   PHYSICAL EXAM: Blood pressure 126/86, pulse 76, temperature 97.6 F (36.4 C), height 5\' 7"  (1.702 m), weight 260 lb (117.9 kg), last menstrual period 07/12/2023, SpO2 97%, unknown if currently breastfeeding. Body mass index is 40.72 kg/m.  General: Well Developed, well nourished, and in Brandi acute distress.  HEENT: Normocephalic, atraumatic; EOMI, sclerae are anicteric. Skin: Warm and dry, good turgor Chest:  Normal excursion, shape, Brandi gross ABN Respiratory: Brandi conversational dyspnea; speaking in full sentences NeuroM-Sk:  Normal gross ROM * 4 extremities  Psych: A and O *3, insight adequate, mood- full   Anthropometric Measurements Height: 5\' 7"  (1.702 m) Weight: 260 lb (117.9 kg) BMI (Calculated): 40.71 Weight at Last Visit: NA Weight Lost Since Last Visit: NA Weight Gained Since Last Visit: NA Starting Weight: 260lb Total Weight Loss (lbs):  (0 kg) (First Visit) Peak Weight: 261lb Waist Measurement : 41.5 inches   Body Composition  Body Fat %: 47.3 % Fat Mass (lbs): 123 lbs Muscle Mass (lbs): 130.2 lbs Total Body Water (lbs): 97.8 lbs Visceral Fat Rating : 12   Other Clinical Data RMR: [id number] Fasting: yes Labs: Yes Today's Visit #:  1 Starting Date: 08/05/23 Comments: First Visit    DIAGNOSTIC DATA REVIEWED:  BMET    Component Value Date/Time   NA 135 01/19/2021 1925   NA 143 08/19/2019 0918   K 3.9 01/19/2021 1925   CL 105 01/19/2021 1925   CO2 22 01/19/2021 1925   GLUCOSE 96 01/19/2021 1925   BUN 18 01/19/2021 1925   BUN 16 08/19/2019 0918   CREATININE 0.62 01/19/2021 1925   CALCIUM 8.8 (L) 01/19/2021 1925   GFRNONAA >60 01/19/2021 1925   GFRAA 134 08/19/2019 0918   Brandi results Davies for: "HGBA1C" Brandi results Davies for: "  INSULIN" Brandi results Davies for: "TSH" CBC    Component Value Date/Time   WBC 10.8 (H) 01/19/2021 1805   RBC 4.13 01/19/2021 1805   HGB 12.7 01/19/2021 1805   HGB 13.9 08/19/2019 0918   HCT 38.1 01/19/2021 1805   HCT 43.2 08/19/2019 0918   PLT 309 01/19/2021 1805   PLT 223 08/19/2019 0918   MCV 92.3 01/19/2021 1805   MCV 97 08/19/2019 0918   MCH 30.8 01/19/2021 1805   MCHC 33.3 01/19/2021 1805   RDW 13.6 01/19/2021 1805   RDW 13.4 08/19/2019 0918   Iron Studies Brandi results Davies for: "IRON", "TIBC", "FERRITIN", "IRONPCTSAT" Lipid Panel     Component Value Date/Time   CHOL 179 08/19/2019 0918   TRIG 82 08/19/2019 0918   HDL 59 08/19/2019 0918   CHOLHDL 3.0 08/19/2019 0918   LDLCALC 105 (H) 08/19/2019 0918   Hepatic Function Panel     Component Value Date/Time   PROT 6.7 01/19/2021 1925   PROT 7.0 08/19/2019 0918   ALBUMIN 3.2 (L) 01/19/2021 1925   ALBUMIN 4.6 08/19/2019 0918   AST 23 01/19/2021 1925   ALT 38 01/19/2021 1925   ALKPHOS 57 01/19/2021 1925   BILITOT 0.2 (L) 01/19/2021 1925   BILITOT 0.5 08/19/2019 0918   Brandi results Davies for: "TSH" Nutritional Brandi results Davies for: "VD25OH"  Attestation Statements:   I, Special Puri, acting as a Stage manager for [employer], DO., have compiled all relevant documentation for today's office visit on behalf of Thomasene Lot, DO, while in the presence of [employer], DO.  Reviewed by clinician on day  of visit: allergies, medications, problem list, medical history, surgical history, family history, social history, and previous encounter notes pertinent to patient's obesity diagnosis. I have spent 62 minutes in the care of the patient today including: preparing to see patient (e.g. review and interpretation of tests, old notes ), obtaining and/or reviewing separately obtained history, performing a medically appropriate examination or evaluation, counseling and educating the patient, ordering medications, test or procedures, documenting clinical information in the electronic or other health care record, and independently interpreting results and communicating results to the patient, family, or caregiver   I have reviewed the above documentation for accuracy and completeness, and I agree with the above. Brandi Davies, D.O.  The 21st Century Cures Act was signed into law in 2016 which includes the topic of electronic health records.  This provides immediate access to information in MyChart.  This includes consultation notes, operative notes, office notes, lab results and pathology reports.  If you have any questions about what you read please let us know at your next visit so we can discuss your concerns and take corrective action if need be.  We are right here with you.

## 2023-08-07 LAB — COMPREHENSIVE METABOLIC PANEL
ALT: 38 [IU]/L — ABNORMAL HIGH (ref 0–32)
AST: 21 [IU]/L (ref 0–40)
Albumin: 4.6 g/dL (ref 3.9–4.9)
Alkaline Phosphatase: 43 [IU]/L — ABNORMAL LOW (ref 44–121)
BUN/Creatinine Ratio: 15 (ref 9–23)
BUN: 12 mg/dL (ref 6–20)
Bilirubin Total: 0.3 mg/dL (ref 0.0–1.2)
CO2: 25 mmol/L (ref 20–29)
Calcium: 9.8 mg/dL (ref 8.7–10.2)
Chloride: 102 mmol/L (ref 96–106)
Creatinine, Ser: 0.78 mg/dL (ref 0.57–1.00)
Globulin, Total: 2.5 g/dL (ref 1.5–4.5)
Glucose: 94 mg/dL (ref 70–99)
Potassium: 4.6 mmol/L (ref 3.5–5.2)
Sodium: 140 mmol/L (ref 134–144)
Total Protein: 7.1 g/dL (ref 6.0–8.5)
eGFR: 102 mL/min/{1.73_m2} (ref >59)

## 2023-08-07 LAB — LIPID PANEL
Chol/HDL Ratio: 3.9 {ratio} (ref 0.0–4.4)
Cholesterol, Total: 201 mg/dL — ABNORMAL HIGH (ref 100–199)
HDL: 51 mg/dL (ref >39)
LDL Chol Calc (NIH): 133 mg/dL — ABNORMAL HIGH (ref 0–99)
Triglycerides: 94 mg/dL (ref 0–149)
VLDL Cholesterol Cal: 17 mg/dL (ref 5–40)

## 2023-08-07 LAB — CBC WITH DIFFERENTIAL/PLATELET
Basophils Absolute: 0 10*3/uL (ref 0.0–0.2)
Basos: 0 %
EOS (ABSOLUTE): 0.1 10*3/uL (ref 0.0–0.4)
Eos: 1 %
Hematocrit: 41.9 % (ref 34.0–46.6)
Hemoglobin: 13.9 g/dL (ref 11.1–15.9)
Immature Grans (Abs): 0 10*3/uL (ref 0.0–0.1)
Immature Granulocytes: 0 %
Lymphocytes Absolute: 2.4 10*3/uL (ref 0.7–3.1)
Lymphs: 28 %
MCH: 31.2 pg (ref 26.6–33.0)
MCHC: 33.2 g/dL (ref 31.5–35.7)
MCV: 94 fL (ref 79–97)
Monocytes Absolute: 0.5 10*3/uL (ref 0.1–0.9)
Monocytes: 6 %
Neutrophils Absolute: 5.4 10*3/uL (ref 1.4–7.0)
Neutrophils: 65 %
Platelets: 221 10*3/uL (ref 150–450)
RBC: 4.45 x10E6/uL (ref 3.77–5.28)
RDW: 12.4 % (ref 11.7–15.4)
WBC: 8.4 10*3/uL (ref 3.4–10.8)

## 2023-08-07 LAB — FOLATE: Folate: 9.8 ng/mL (ref >3.0)

## 2023-08-07 LAB — TSH: TSH: 1.15 u[IU]/mL (ref 0.450–4.500)

## 2023-08-07 LAB — VITAMIN B12: Vitamin B-12: 485 pg/mL (ref 232–1245)

## 2023-08-07 LAB — T4, FREE: Free T4: 0.98 ng/dL (ref 0.82–1.77)

## 2023-08-07 LAB — INSULIN, RANDOM: INSULIN: 18.6 u[IU]/mL (ref 2.6–24.9)

## 2023-08-07 LAB — HEMOGLOBIN A1C
Est. average glucose Bld gHb Est-mCnc: 120 mg/dL
Hgb A1c MFr Bld: 5.8 % — ABNORMAL HIGH (ref 4.8–5.6)

## 2023-08-07 LAB — VITAMIN D 25 HYDROXY (VIT D DEFICIENCY, FRACTURES): Vit D, 25-Hydroxy: 25.2 ng/mL — ABNORMAL LOW (ref 30.0–100.0)

## 2023-08-19 ENCOUNTER — Encounter (INDEPENDENT_AMBULATORY_CARE_PROVIDER_SITE_OTHER): Payer: Self-pay | Admitting: Family Medicine

## 2023-08-19 ENCOUNTER — Ambulatory Visit (INDEPENDENT_AMBULATORY_CARE_PROVIDER_SITE_OTHER): Payer: 59 | Admitting: Family Medicine

## 2023-08-19 VITALS — BP 121/82 | HR 91 | Temp 99.2°F | Ht 67.0 in | Wt 251.0 lb

## 2023-08-19 DIAGNOSIS — E669 Obesity, unspecified: Secondary | ICD-10-CM

## 2023-08-19 DIAGNOSIS — E559 Vitamin D deficiency, unspecified: Secondary | ICD-10-CM

## 2023-08-19 DIAGNOSIS — R7303 Prediabetes: Secondary | ICD-10-CM

## 2023-08-19 DIAGNOSIS — R748 Abnormal levels of other serum enzymes: Secondary | ICD-10-CM | POA: Diagnosis not present

## 2023-08-19 DIAGNOSIS — E7849 Other hyperlipidemia: Secondary | ICD-10-CM | POA: Diagnosis not present

## 2023-08-19 DIAGNOSIS — Z6839 Body mass index (BMI) 39.0-39.9, adult: Secondary | ICD-10-CM

## 2023-08-19 DIAGNOSIS — R7401 Elevation of levels of liver transaminase levels: Secondary | ICD-10-CM

## 2023-08-19 DIAGNOSIS — Z6841 Body Mass Index (BMI) 40.0 and over, adult: Secondary | ICD-10-CM

## 2023-08-19 MED ORDER — VITAMIN D (ERGOCALCIFEROL) 1.25 MG (50000 UNIT) PO CAPS: 50000.0000 [IU] | ORAL_CAPSULE | ORAL | 0 refills | Status: DC

## 2023-08-19 NOTE — Progress Notes (Signed)
 [name], D.O.  ABFM, ABOM Clinical Bariatric Medicine Physician  Office located at: [address] [place], [place]  [address]   Assessment and Plan:   FOR THE DISEASE OF OBESITY:  BMI 40.0-44.9, adult (HCC) - current BMI 39.3 Obesity Beginning BMI 40.8 Assessment & Plan: Since last office visit on 08/05/2023 patient's  Muscle mass has increased by 8.8 lb. Fat mass has decreased by 18.2 lb. Total body water has increased by 0.6 lb.  Counseling done on how various foods will affect these numbers and how to maximize success  Total lbs lost to date: 9 lbs  Total weight loss percentage to date: 3.46%    Recommended Dietary Goals Brandi Davies is currently in the action stage of change. As such, her goal is to continue weight management plan.  Will switch her to the CAT 4 MP because she lost too much weight in the past 2 weeks.    Behavioral Intervention We discussed the following today: how she can make the foods she loves in a healthy manner, adaptive thermogenesis, increasing lean protein intake at dinner.   Additional resources provided today: handout on CAT 4 meal plan and handout on Adaptive Thermogenesis , handout on Pre-DM and I.R  Evidence-based interventions for health behavior change were utilized today including the discussion of self monitoring techniques, problem-solving barriers and SMART goal setting techniques.  Regarding patient's less desirable eating habits and patterns, we employed the technique of small changes.   Pt will specifically work on: n/a    Recommended Physical Activity Goals Brandi Davies has been advised to work up to 150 minutes of moderate intensity aerobic activity a week and strengthening exercises 2-3 times per week for cardiovascular health, weight loss maintenance and preservation of muscle mass.   She has agreed to : Continue current level of physical activity    Pharmacotherapy We both agreed to : continue with nutritional and behavioral  strategies. I do not recommend initiating AOMs at this time since pt does not have persistent struggles with hunger, cravings, or intrusive thoughts surrounding foods.    FOR ASSOCIATED CONDITIONS ADDRESSED TODAY:  Prediabetes Assessment & Plan: Lab Results  Component Value Date   HGBA1C 5.8 (H) 08/05/2023   INSULIN 18.6 08/05/2023   Lab Results  Component Value Date   CREATININE 0.78 08/05/2023   BUN 12 08/05/2023   NA 140 08/05/2023   K 4.6 08/05/2023   CL 102 08/05/2023   CO2 25 08/05/2023   Lab Results  Component Value Date   WBC 8.4 08/05/2023   HGB 13.9 08/05/2023   HCT 41.9 08/05/2023   MCV 94 08/05/2023   PLT 221 08/05/2023   Lab Results  Component Value Date   TSH 1.150 08/05/2023   FREET4 0.98 08/05/2023   Lab Results  Component Value Date   VITAMINB12 485 08/05/2023   FOLATE 9.8 08/05/2023   HbA1c of 5.8 - consistent w/ pre-dm. Fasting insulin levels are roughly 4 times normal. Kidney function, blood counts, thyroid levels, folate are within normal limits.  B12 level is 485, not at goal of over 500. I anticipate her B12 levels will improve as she continues to focus on B12 rich foods.    Patient aware of disease state and risk of progression. Explained role of simple carbs and insulin levels on hunger and cravings. Educated patient that having adequate amounts of protein with each meal is important for increasing muscle mass, stabilizing sugars, controlling hunger and cravings, and improving thermogenesis. Continue working  on nutrition plan to decrease simple carbohydrates, increase lean proteins and exercise to promote weight loss and improve glycemic control and prevent progression to T2DM.   Other hyperlipidemia - new onset Assessment & Plan: Lab Results  Component Value Date   CHOL 201 (H) 08/05/2023   HDL 51 08/05/2023   LDLCALC 133 (H) 08/05/2023   TRIG 94 08/05/2023   CHOLHDL 3.9 08/05/2023   Lipid panel WNL w/ exception of LDL. Pt advised to  reduce saturated and trans fats in diet. Recommend pt to explore the American Heart Association website for further information about high cholesterol. Continue to work on Engineer, technical sales. Advance exercise as able.    Elevated liver enzymes - new onset Assessment & Plan:    Component Value Date/Time   PROT 7.1 08/05/2023 1048   ALBUMIN 4.6 08/05/2023 1048   AST 21 08/05/2023 1048   ALT 38 (H) 08/05/2023 1048   ALKPHOS 43 (L) 08/05/2023 1048   BILITOT 0.3 08/05/2023 1048    Her Alkaline Phosphatase is mildly elevated at 43 which could be related to her deficiency in vitamin D. Her ALT is also mildly elevated at 38 which could be from fatty liver. Discussed reducing saturated fats as well as simple and added sugars in their diet.  Also avoidance of alcohol. Losing 10% of body weight may improve condition.     Vitamin D deficiency - new onset Assessment & Plan: Lab Results  Component Value Date   VD25OH 25.2 (L) 08/05/2023   Her Vitamin D levels are not at goal of 50 to 70. Low vitamin D levels can be associated with adiposity and may result in leptin resistance and weight gain. Also associated with fatigue. After discussion of benefits, alternative treatment options and side effects patient will be started on ERGO 50,000 units once weekly.   Relevant Orders:  -     Vitamin D (Ergocalciferol); Take 1 capsule (50,000 Units total) by mouth every 7 (seven) days.  Dispense: 4 capsule; Refill: 0   FOLLOW UP:   Return 09/08/2023. She was informed of the importance of frequent follow up visits to maximize her success with intensive lifestyle modifications for her multiple health conditions.  Subjective:   Chief complaint: Obesity Brandi Davies is here to discuss her progress with her obesity treatment plan. She is on the CAT 3 MP with L options and only 100 snack calories and states she is following her eating plan approximately 75-80 % of the time. She states she is walking and or using the  elliptical 30 minutes 2-3 days a week.    Interval History:  Brandi Davies is here today for her first follow-up office visit since starting the program with [place]. Patient is off to a great start and has lost 9 lbs. States that her clothes fit a bit looser. Measured her proteins and veggies. Reports eating 6 ounces of lean protein at dinner instead of 10 ounces. Sometimes felt hungry between lunch and dinner. States she made healthy choices when eating outside the home. Inquires about Wegovy.   Pharmacotherapy for weight loss: She is not currently taking medications  for medical weight loss.    Review of Systems:  Pertinent positives were addressed with patient today.  Reviewed by clinician on day of visit: allergies, medications, problem list, medical history, surgical history, family history, social history, and previous encounter notes.  Weight Summary and Biometrics   Weight Lost Since Last Visit: 9 lb  Weight Gained Since Last Visit: 0   Vitals  Temp: 99.2 F (37.3 C) BP: 121/82 Pulse Rate: 91 SpO2: 97 %   Anthropometric Measurements Height: 5\' 7"  (1.702 m) Weight: 251 lb (113.9 kg) BMI (Calculated): 39.3 Weight at Last Visit: 260 lb Weight Lost Since Last Visit: 9 lb Weight Gained Since Last Visit: 0 Starting Weight: 260 lb Total Weight Loss (lbs): 9 lb (4.082 kg) Peak Weight: 261 lb   Body Composition  Body Fat %: 41.7 % Fat Mass (lbs): 104.8 lbs Muscle Mass (lbs): 139 lbs Total Body Water (lbs): 98.4 lbs Visceral Fat Rating : 10   Other Clinical Data Fasting: No Labs: No Today's Visit #: 2 Starting Date: 08/05/23   Objective:   PHYSICAL EXAM:  Blood pressure 121/82, pulse 91, temperature 99.2 F (37.3 C), height 5\' 7"  (1.702 m), weight 251 lb (113.9 kg), last menstrual period 07/12/2023, SpO2 97%, unknown if currently breastfeeding. Body mass index is 39.31 kg/m.  General: she is overweight, cooperative and in no acute distress.   HEENT: EOMI, sclerae  are anicteric. Lungs: Normal breathing effort, no conversational dyspnea. M-Sk:  Normal gross ROM * 4 extremities  PSYCH: Has normal mood, affect and thought process. Neurologic: No gross sensory or motor deficits. Well developed, A and O * 3  DIAGNOSTIC DATA REVIEWED:  BMET    Component Value Date/Time   NA 140 08/05/2023 1048   K 4.6 08/05/2023 1048   CL 102 08/05/2023 1048   CO2 25 08/05/2023 1048   GLUCOSE 94 08/05/2023 1048   GLUCOSE 96 01/19/2021 1925   BUN 12 08/05/2023 1048   CREATININE 0.78 08/05/2023 1048   CALCIUM 9.8 08/05/2023 1048   GFRNONAA >60 01/19/2021 1925   GFRAA 134 08/19/2019 0918   Lab Results  Component Value Date   HGBA1C 5.8 (H) 08/05/2023   Lab Results  Component Value Date   INSULIN 18.6 08/05/2023   Lab Results  Component Value Date   TSH 1.150 08/05/2023   CBC    Component Value Date/Time   WBC 8.4 08/05/2023 1048   WBC 10.8 (H) 01/19/2021 1805   RBC 4.45 08/05/2023 1048   RBC 4.13 01/19/2021 1805   HGB 13.9 08/05/2023 1048   HCT 41.9 08/05/2023 1048   PLT 221 08/05/2023 1048   MCV 94 08/05/2023 1048   MCH 31.2 08/05/2023 1048   MCH 30.8 01/19/2021 1805   MCHC 33.2 08/05/2023 1048   MCHC 33.3 01/19/2021 1805   RDW 12.4 08/05/2023 1048   Iron Studies No results found for: "IRON", "TIBC", "FERRITIN", "IRONPCTSAT" Lipid Panel     Component Value Date/Time   CHOL 201 (H) 08/05/2023 1048   TRIG 94 08/05/2023 1048   HDL 51 08/05/2023 1048   CHOLHDL 3.9 08/05/2023 1048   LDLCALC 133 (H) 08/05/2023 1048   Hepatic Function Panel     Component Value Date/Time   PROT 7.1 08/05/2023 1048   ALBUMIN 4.6 08/05/2023 1048   AST 21 08/05/2023 1048   ALT 38 (H) 08/05/2023 1048   ALKPHOS 43 (L) 08/05/2023 1048   BILITOT 0.3 08/05/2023 1048      Component Value Date/Time   TSH 1.150 08/05/2023 1048   Nutritional Lab Results  Component Value Date   VD25OH 25.2 (L) 08/05/2023    Attestations:   I, Special Puri, acting as a  Stage manager for [employer], DO., have compiled all relevant documentation for today's office visit on behalf of Thomasene Lot, DO, while in the presence of [employer], DO.  Reviewed by clinician on day of  visit: allergies, medications, problem list, medical history, surgical history, family history, social history, and previous encounter notes pertinent to patient's obesity diagnosis.   I have spent 60 minutes in the care of the patient today including: preparing to see patient (e.g. review and interpretation of tests, old notes ), obtaining and/or reviewing separately obtained history, performing a medically appropriate examination or evaluation, counseling and educating the patient, ordering medications, test or procedures, documenting clinical information in the electronic or other health care record, and independently interpreting results and communicating results to the patient, family, or caregiver   I have reviewed the above documentation for accuracy and completeness, and I agree with the above. [name], D.O.  The 21st Century Cures Act was signed into law in 2016 which includes the topic of electronic health records.  This provides immediate access to information in MyChart.  This includes consultation notes, operative notes, office notes, lab results and pathology reports.  If you have any questions about what you read please let us know at your next visit so we can discuss your concerns and take corrective action if need be.  We are right here with you.

## 2023-09-08 ENCOUNTER — Ambulatory Visit (INDEPENDENT_AMBULATORY_CARE_PROVIDER_SITE_OTHER): Payer: 59 | Admitting: Family Medicine

## 2023-09-08 ENCOUNTER — Encounter (INDEPENDENT_AMBULATORY_CARE_PROVIDER_SITE_OTHER): Payer: Self-pay | Admitting: Family Medicine

## 2023-09-08 ENCOUNTER — Other Ambulatory Visit (INDEPENDENT_AMBULATORY_CARE_PROVIDER_SITE_OTHER): Payer: Self-pay | Admitting: Family Medicine

## 2023-09-08 VITALS — BP 124/85 | HR 89 | Temp 98.2°F | Ht 67.0 in | Wt 251.0 lb

## 2023-09-08 DIAGNOSIS — R7303 Prediabetes: Secondary | ICD-10-CM | POA: Diagnosis not present

## 2023-09-08 DIAGNOSIS — Z6839 Body mass index (BMI) 39.0-39.9, adult: Secondary | ICD-10-CM | POA: Diagnosis not present

## 2023-09-08 DIAGNOSIS — E559 Vitamin D deficiency, unspecified: Secondary | ICD-10-CM

## 2023-09-08 DIAGNOSIS — E669 Obesity, unspecified: Secondary | ICD-10-CM | POA: Diagnosis not present

## 2023-09-08 DIAGNOSIS — Z6841 Body Mass Index (BMI) 40.0 and over, adult: Secondary | ICD-10-CM

## 2023-09-08 MED ORDER — VITAMIN D (ERGOCALCIFEROL) 1.25 MG (50000 UNIT) PO CAPS: 50000.0000 [IU] | ORAL_CAPSULE | ORAL | 0 refills | Status: DC

## 2023-09-08 NOTE — Progress Notes (Signed)
 Brandi Davies, D.O.  ABFM, ABOM Specializing in Clinical Bariatric Medicine  Office located at: [address] [place], [place]  [address]   Assessment and Plan:   No orders of the defined types were placed in this encounter.   Medications Discontinued During This Encounter  Medication Reason   Vitamin D, Ergocalciferol, (DRISDOL) 1.25 MG (50000 UNIT) CAPS capsule Reorder     Meds ordered this encounter  Medications   Vitamin D, Ergocalciferol, (DRISDOL) 1.25 MG (50000 UNIT) CAPS capsule    Sig: Take 1 capsule (50,000 Units total) by mouth every 7 (seven) days.    Dispense:  4 capsule    Refill:  0     FOR THE DISEASE OF OBESITY:  BMI 40.0-44.9, adult (HCC) - current BMI 39.3 Obesity Beginning BMI 40.8 Assessment & Plan: Since last office visit on 08/19/2023 patient's Muscle mass has decreased by 6.4 lb. Fat mass has increased by 7.2 lb. Total body water has decreased by 3.6 lb.  Counseling done on how various foods will affect these numbers and how to maximize success  Total lbs lost to date: 9 lbs  Total weight loss percentage to date: 3.46%    Recommended Dietary Goals Brandi Davies is currently in the action stage of change. As such, her goal is to continue weight management plan.  She has agreed to: START keeping a food journal with a target of 1550-1650 calories and 115+ grams protein per day w/ the CAT 3 MP and only 100 snack calories as a guide.    Behavioral Intervention We discussed the following today: work on tracking and journaling calories using tracking application, going to the [employer] website for recipe ideas, and using GPT or another AI platform for recipe ideas- searching "low calorie, low carb, high protein chicken recipes" etc, focusing on foods with a 10:1 ratio of calories to grams of protein, high protein pasta options, and sources of healthy fats.   Additional resources provided today: Handout on recipe packet I, Handout on recipe packet  II, and Physician provided patient with handouts and personalized instruction on tracking and journaling using Apps (or how to handwrite in notebook) and using logs provided   Evidence-based interventions for health behavior change were utilized today including the discussion of self monitoring techniques, problem-solving barriers and SMART goal setting techniques.   Regarding patient's less desirable eating habits and patterns, we employed the technique of small changes.   Pt will specifically work on: n/a   Recommended Physical Activity Goals Geraldine has been advised to work up to 150 minutes of moderate intensity aerobic activity a week and strengthening exercises 2-3 times per week for cardiovascular health, weight loss maintenance and preservation of muscle mass.   She has agreed to : continue to gradually increase the amount and intensity of exercise routine   Pharmacotherapy We both agreed to : continue with nutritional and behavioral strategies   FOR ASSOCIATED CONDITIONS ADDRESSED TODAY:  Prediabetes Assessment & Plan: Hunger and cravings are controlled when following her PNP. Continue focusing on protein, fruits, and vegetables while limiting simple carbohydrates. Pt will also start journaling intake. Losing 10% or more of body weight may improve condition.    Vitamin D deficiency Assessment & Plan: Currently on Ergocalciferol 50,000 units weekly without any adverse effects such as nausea, vomiting or muscle weakness. Continue regimen. Recheck in 2-3 mos.   Relevant Orders:  -     Vitamin D (Ergocalciferol); Take 1 capsule (50,000 Units total) by mouth  every 7 (seven) days.  Dispense: 4 capsule; Refill: 0   Follow up:   Return 10/08/2023. She was informed of the importance of frequent follow up visits to maximize her success with intensive lifestyle modifications for her multiple health conditions.  Subjective:   Chief complaint: Obesity Brandi Davies is here to discuss her  progress with her obesity treatment plan. She is on the Category 4 Plan and states she is following her eating plan approximately 75-80% of the time. She states she is walking 30-45 minutes 2-3 days per week.  Interval History:  Brandi Davies is here for a follow up office visit. Since last OV on 08/19/2023, Brandi Davies wt has not changed. She admits to feeling bored w/ certain foods on her meal plan. She felt that the CAT 3 MP was more easier to follow than the CAT 4. She is interested in journaling her intake to have more flexibility with foods choices. Endorses that work has been stressful lately.   Pharmacotherapy for weight loss: She is currently taking no anti-obesity medication.   Review of Systems:  Pertinent positives were addressed with patient today.  Reviewed by clinician on day of visit: allergies, medications, problem list, medical history, surgical history, family history, social history, and previous encounter notes.  Weight Summary and Biometrics   Weight Lost Since Last Visit: 0  Weight Gained Since Last Visit: 0   Vitals Temp: 98.2 F (36.8 C) BP: 124/85 Pulse Rate: 89 SpO2: 98 %   Anthropometric Measurements Height: 5\' 7"  (1.702 m) Weight: 251 lb (113.9 kg) BMI (Calculated): 39.3 Weight at Last Visit: 251lb Weight Lost Since Last Visit: 0 Weight Gained Since Last Visit: 0 Starting Weight: 260lb Total Weight Loss (lbs): 9 lb (4.082 kg) Peak Weight: 261lb   Body Composition  Body Fat %: 44.5 % Fat Mass (lbs): 112 lbs Muscle Mass (lbs): 132.6 lbs Total Body Water (lbs): 94.8 lbs Visceral Fat Rating : 11   Other Clinical Data Fasting: no Labs: no Today's Visit #: 3 Starting Date: 08/05/23    Objective:   PHYSICAL EXAM: Blood pressure 124/85, pulse 89, temperature 98.2 F (36.8 C), height 5\' 7"  (1.702 m), weight 251 lb (113.9 kg), last menstrual period 08/09/2023, SpO2 98%, unknown if currently breastfeeding. Body mass index is 39.31  kg/m.  General: she is overweight, cooperative and in no acute distress. PSYCH: Has normal mood, affect and thought process.   HEENT: EOMI, sclerae are anicteric. Lungs: Normal breathing effort, no conversational dyspnea. Extremities: Moves * 4 Neurologic: A and O * 3, good insight  DIAGNOSTIC DATA REVIEWED: BMET    Component Value Date/Time   NA 140 08/05/2023 1048   K 4.6 08/05/2023 1048   CL 102 08/05/2023 1048   CO2 25 08/05/2023 1048   GLUCOSE 94 08/05/2023 1048   GLUCOSE 96 01/19/2021 1925   BUN 12 08/05/2023 1048   CREATININE 0.78 08/05/2023 1048   CALCIUM 9.8 08/05/2023 1048   GFRNONAA >60 01/19/2021 1925   GFRAA 134 08/19/2019 0918   Lab Results  Component Value Date   HGBA1C 5.8 (H) 08/05/2023   Lab Results  Component Value Date   INSULIN 18.6 08/05/2023   Lab Results  Component Value Date   TSH 1.150 08/05/2023   CBC    Component Value Date/Time   WBC 8.4 08/05/2023 1048   WBC 10.8 (H) 01/19/2021 1805   RBC 4.45 08/05/2023 1048   RBC 4.13 01/19/2021 1805   HGB 13.9 08/05/2023 1048   HCT 41.9 08/05/2023  1048   PLT 221 08/05/2023 1048   MCV 94 08/05/2023 1048   MCH 31.2 08/05/2023 1048   MCH 30.8 01/19/2021 1805   MCHC 33.2 08/05/2023 1048   MCHC 33.3 01/19/2021 1805   RDW 12.4 08/05/2023 1048   Iron Studies No results found for: "IRON", "TIBC", "FERRITIN", "IRONPCTSAT" Lipid Panel     Component Value Date/Time   CHOL 201 (H) 08/05/2023 1048   TRIG 94 08/05/2023 1048   HDL 51 08/05/2023 1048   CHOLHDL 3.9 08/05/2023 1048   LDLCALC 133 (H) 08/05/2023 1048   Hepatic Function Panel     Component Value Date/Time   PROT 7.1 08/05/2023 1048   ALBUMIN 4.6 08/05/2023 1048   AST 21 08/05/2023 1048   ALT 38 (H) 08/05/2023 1048   ALKPHOS 43 (L) 08/05/2023 1048   BILITOT 0.3 08/05/2023 1048      Component Value Date/Time   TSH 1.150 08/05/2023 1048   Nutritional Lab Results  Component Value Date   VD25OH 25.2 (L) 08/05/2023     Attestations:   I, Special Puri, acting as a Stage manager for Thomasene Lot, DO., have compiled all relevant documentation for today's office visit on behalf of Thomasene Lot, DO, while in the presence of [employer], DO.  Patient was in the office today and time spent on visit including pre-visit chart review and post-visit care/coordination of care and electronic medical record documentation was 40 minutes. 50% of the time was in face to face counseling of this patient's medical condition(s) and providing education on treatment options to include the first-line treatment of diet and lifestyle modification.   I have reviewed the above documentation for accuracy and completeness, and I agree with the above. Brandi Davies, D.O.  The 21st Century Cures Act was signed into law in 2016 which includes the topic of electronic health records.  This provides immediate access to information in MyChart.  This includes consultation notes, operative notes, office notes, lab results and pathology reports.  If you have any questions about what you read please let us know at your next visit so we can discuss your concerns and take corrective action if need be.  We are right here with you.

## 2023-09-29 ENCOUNTER — Other Ambulatory Visit (INDEPENDENT_AMBULATORY_CARE_PROVIDER_SITE_OTHER): Payer: Self-pay | Admitting: Family Medicine

## 2023-09-29 DIAGNOSIS — E559 Vitamin D deficiency, unspecified: Secondary | ICD-10-CM

## 2023-10-08 ENCOUNTER — Ambulatory Visit (INDEPENDENT_AMBULATORY_CARE_PROVIDER_SITE_OTHER): Admitting: Family Medicine

## 2023-10-08 ENCOUNTER — Encounter (INDEPENDENT_AMBULATORY_CARE_PROVIDER_SITE_OTHER): Payer: Self-pay | Admitting: Family Medicine

## 2023-10-08 VITALS — BP 134/85 | HR 75 | Temp 98.7°F | Ht 67.0 in | Wt 246.0 lb

## 2023-10-08 DIAGNOSIS — Z6838 Body mass index (BMI) 38.0-38.9, adult: Secondary | ICD-10-CM

## 2023-10-08 DIAGNOSIS — E559 Vitamin D deficiency, unspecified: Secondary | ICD-10-CM

## 2023-10-08 DIAGNOSIS — R7303 Prediabetes: Secondary | ICD-10-CM | POA: Diagnosis not present

## 2023-10-08 DIAGNOSIS — E669 Obesity, unspecified: Secondary | ICD-10-CM

## 2023-10-08 MED ORDER — VITAMIN D (ERGOCALCIFEROL) 1.25 MG (50000 UNIT) PO CAPS: 50000.0000 [IU] | ORAL_CAPSULE | ORAL | 1 refills | Status: DC

## 2023-10-08 NOTE — Progress Notes (Signed)
 Rae Bugler, D.O.  ABFM, ABOM Specializing in Clinical Bariatric Medicine  Office located at: [address] [place], [place]  [address]   Assessment and Plan:  No orders of the defined types were placed in this encounter.  Medications Discontinued During This Encounter  Medication Reason   docusate sodium  (COLACE) 100 MG capsule    Vitamin D , Ergocalciferol , (DRISDOL ) 1.25 MG (50000 UNIT) CAPS capsule Reorder    Meds ordered this encounter  Medications   Vitamin D , Ergocalciferol , (DRISDOL ) 1.25 MG (50000 UNIT) CAPS capsule    Sig: Take 1 capsule (50,000 Units total) by mouth every 7 (seven) days.    Dispense:  4 capsule    Refill:  1    FOR THE DISEASE OF OBESITY:  BMI 38.0-38.9,adult - current 38.52 Obesity Beginning BMI 40.8 Assessment & Plan: Since last office visit on 09/08/2023, patient's muscle mass has decreased by 2.2 lbs. Fat mass has decreased by 2.8 lbs. Total body water  has increased by 0.2 lbs.  Counseling done on how various foods will affect these numbers and how to maximize success  Total lbs lost to date: 14 lbs Total weight loss percentage to date: 5.38%    Recommended Dietary Goals Vianey is currently in the action stage of change. As such, her goal is to continue weight management plan.  She has agreed to: continue current plan   Behavioral Intervention We discussed the following today: continue to work on maintaining a reduced calorie state, getting the recommended amount of protein, incorporating whole foods, making healthy choices, staying well hydrated and practicing mindfulness when eating.  Additional resources provided today: Handout on Examples of Low Glycemic Index and Low Calorie Fruits & Vegetables  Evidence-based interventions for health behavior change were utilized today including the discussion of self monitoring techniques, problem-solving barriers and SMART goal setting techniques.   Regarding patient's less desirable  eating habits and patterns, we employed the technique of small changes.   Pt will specifically work on: n/a   Recommended Physical Activity Goals Jimesha has been advised to work up to 300-450 minutes of moderate intensity aerobic activity a week and strengthening exercises 2-3 times per week for cardiovascular health, weight loss maintenance and preservation of muscle mass.   She has agreed to: Walk 45 minutes 4 days per week   Pharmacotherapy We both agreed to: continue with nutritional and behavioral strategies   FOR ASSOCIATED CONDITIONS ADDRESSED TODAY:  Prediabetes Assessment & Plan: Aarti is not on any medications for this condition. Diet/exercise approach. Hunger/cravings well controlled. Anticipatory guidance given. Geral will continue to work on weight loss, exercise, and decreasing simple carbohydrates to help decrease the risk of diabetes. We will recheck A1c and/or fasting insulin  level in approximately 3 months from last check, or as deemed appropriate.    Vitamin D  deficiency Assessment & Plan: Annalysia is taking ERGO 50,000 units once weekly. She is tolerating well, denies any muscle weakness or N/V. Continue taking supplement at current dose. Will refill today.   Relevant Orders: -     Vitamin D  (Ergocalciferol ); Take 1 capsule (50,000 Units total) by mouth every 7 (seven) days.  Dispense: 4 capsule; Refill: 1  Follow up:   Return in about 6 weeks (around 11/17/2023). She was informed of the importance of frequent follow up visits to maximize her success with intensive lifestyle modifications for her multiple health conditions.  Subjective:   Chief complaint: Obesity Jazzilyn is here to discuss her progress with her obesity treatment plan.  She is keeping a food journal with a target of 1550-1650 calories and 115+ grams protein per day w/ the CAT 3 MP and only 100 snack calories as a guide and states she is following her eating plan approximately 75% of the time. She states  she is walking 45 minutes 2 days per week.  Interval History:  Yarethzy Ellinger is here for a follow up office visit. Since last OV on 09/08/2023, pt is down 5 lbs. She reports that hitting protein goals has been easier to manage. Pt is eating yogurt, cheese, or yasso bars for snacks, and occasionally protein bars. Additionally, Taahira started journaling but after getting a sinus infection she has not been consistent. However, she is tracking her food intake on [employer].  Pharmacotherapy for weight loss: She is currently taking no anti-obesity medication.   Review of Systems:  Pertinent positives were addressed with patient today.  Reviewed by clinician on day of visit: allergies, medications, problem list, medical history, surgical history, family history, social history, and previous encounter notes.  Weight Summary and Biometrics   Weight Lost Since Last Visit: 5lb  Weight Gained Since Last Visit: 0lb   Vitals Temp: 98.7 F (37.1 C) BP: 134/85 Pulse Rate: 75 SpO2: 97 %   Anthropometric Measurements Height: 5\' 7"  (1.702 m) Weight: 246 lb (111.6 kg) BMI (Calculated): 38.52 Weight at Last Visit: 251lb Weight Lost Since Last Visit: 5lb Weight Gained Since Last Visit: 0lb Starting Weight: 260lb Total Weight Loss (lbs): 14 lb (6.35 kg) Peak Weight: 261lb   Body Composition  Body Fat %: 44.3 % Fat Mass (lbs): 109.2 lbs Muscle Mass (lbs): 130.4 lbs Total Body Water  (lbs): 95 lbs Visceral Fat Rating : 11   Other Clinical Data Fasting: No Labs: No Today's Visit #: '4 Starting Date: 08/05/23   Objective:   PHYSICAL EXAM: Blood pressure 134/85, pulse 75, temperature 98.7 F (37.1 C), height 5\' 7"  (1.702 m), weight 246 lb (111.6 kg), last menstrual period 08/09/2023, SpO2 97%, unknown if currently breastfeeding. Body mass index is 38.53 kg/m.  General: she is overweight, cooperative and in no acute distress. PSYCH: Has normal mood, affect and thought process.   HEENT:  EOMI, sclerae are anicteric. Lungs: Normal breathing effort, no conversational dyspnea. Extremities: Moves * 4 Neurologic: A and O * 3, good insight  DIAGNOSTIC DATA REVIEWED: BMET    Component Value Date/Time   NA 140 08/05/2023 1048   K 4.6 08/05/2023 1048   CL 102 08/05/2023 1048   CO2 25 08/05/2023 1048   GLUCOSE 94 08/05/2023 1048   GLUCOSE 96 01/19/2021 1925   BUN 12 08/05/2023 1048   CREATININE 0.78 08/05/2023 1048   CALCIUM  9.8 08/05/2023 1048   GFRNONAA >60 01/19/2021 1925   GFRAA 134 08/19/2019 0918   Lab Results  Component Value Date   HGBA1C 5.8 (H) 08/05/2023   Lab Results  Component Value Date   INSULIN  18.6 08/05/2023   Lab Results  Component Value Date   TSH 1.150 08/05/2023   CBC    Component Value Date/Time   WBC 8.4 08/05/2023 1048   WBC 10.8 (H) 01/19/2021 1805   RBC 4.45 08/05/2023 1048   RBC 4.13 01/19/2021 1805   HGB 13.9 08/05/2023 1048   HCT 41.9 08/05/2023 1048   PLT 221 08/05/2023 1048   MCV 94 08/05/2023 1048   MCH 31.2 08/05/2023 1048   MCH 30.8 01/19/2021 1805   MCHC 33.2 08/05/2023 1048   MCHC 33.3 01/19/2021 1805   RDW  12.4 08/05/2023 1048   Iron Studies No results found for: "IRON", "TIBC", "FERRITIN", "IRONPCTSAT" Lipid Panel     Component Value Date/Time   CHOL 201 (H) 08/05/2023 1048   TRIG 94 08/05/2023 1048   HDL 51 08/05/2023 1048   CHOLHDL 3.9 08/05/2023 1048   LDLCALC 133 (H) 08/05/2023 1048   Hepatic Function Panel     Component Value Date/Time   PROT 7.1 08/05/2023 1048   ALBUMIN 4.6 08/05/2023 1048   AST 21 08/05/2023 1048   ALT 38 (H) 08/05/2023 1048   ALKPHOS 43 (L) 08/05/2023 1048   BILITOT 0.3 08/05/2023 1048      Component Value Date/Time   TSH 1.150 08/05/2023 1048   Nutritional Lab Results  Component Value Date   VD25OH 25.2 (L) 08/05/2023    Attestations:   I, Camryn Mix, acting as a Stage manager for [employer], DO., have compiled all relevant documentation for today's office  visit on behalf of Marceil Sensor, DO, while in the presence of [employer], DO.  I have reviewed the above documentation for accuracy and completeness, and I agree with the above. Rae Bugler, D.O.  The 21st Century Cures Act was signed into law in 2016 which includes the topic of electronic health records.  This provides immediate access to information in MyChart.  This includes consultation notes, operative notes, office notes, lab results and pathology reports.  If you have any questions about what you read please let us  know at your next visit so we can discuss your concerns and take corrective action if need be.  We are right here with you.

## 2023-10-21 ENCOUNTER — Other Ambulatory Visit (INDEPENDENT_AMBULATORY_CARE_PROVIDER_SITE_OTHER): Payer: Self-pay | Admitting: Family Medicine

## 2023-10-21 DIAGNOSIS — E559 Vitamin D deficiency, unspecified: Secondary | ICD-10-CM

## 2023-11-17 ENCOUNTER — Ambulatory Visit (INDEPENDENT_AMBULATORY_CARE_PROVIDER_SITE_OTHER): Admitting: Family Medicine

## 2023-11-17 ENCOUNTER — Encounter (INDEPENDENT_AMBULATORY_CARE_PROVIDER_SITE_OTHER): Payer: Self-pay | Admitting: Family Medicine

## 2023-11-17 VITALS — BP 126/87 | HR 93 | Temp 98.8°F | Ht 67.0 in | Wt 244.0 lb

## 2023-11-17 DIAGNOSIS — Z6838 Body mass index (BMI) 38.0-38.9, adult: Secondary | ICD-10-CM

## 2023-11-17 DIAGNOSIS — E559 Vitamin D deficiency, unspecified: Secondary | ICD-10-CM | POA: Diagnosis not present

## 2023-11-17 DIAGNOSIS — R7303 Prediabetes: Secondary | ICD-10-CM | POA: Diagnosis not present

## 2023-11-17 DIAGNOSIS — E669 Obesity, unspecified: Secondary | ICD-10-CM | POA: Diagnosis not present

## 2023-11-17 MED ORDER — VITAMIN D (ERGOCALCIFEROL) 1.25 MG (50000 UNIT) PO CAPS
50000.0000 [IU] | ORAL_CAPSULE | ORAL | 0 refills | Status: DC
Start: 2023-11-17 — End: 2024-02-02

## 2023-11-17 NOTE — Progress Notes (Signed)
 Brandi Davies, D.O.  ABFM, ABOM Specializing in Clinical Bariatric Medicine  Office located at: [address] [place], [place]  [address]   Assessment and Plan:   Medications Discontinued During This Encounter  Medication Reason   Vitamin D , Ergocalciferol , (DRISDOL ) 1.25 MG (50000 UNIT) CAPS capsule Reorder     Meds ordered this encounter  Medications   Vitamin D , Ergocalciferol , (DRISDOL ) 1.25 MG (50000 UNIT) CAPS capsule    Sig: Take 1 capsule (50,000 Units total) by mouth every 7 (seven) days.    Dispense:  12 capsule    Refill:  0    RECHECK VD, liver enzymes, & A1c next OV   FOR THE DISEASE OF OBESITY:  BMI 38.0-38.9,adult - current 38.21 Obesity Beginning BMI 40.8 Assessment & Plan: Since last office visit on 10/08/2023 patient's  Muscle mass has increased by 7.2 lb. Fat mass has decreased by 9.8 lb. Total body water  has not changed at 95 lbs. Counseling done on how various foods will affect these numbers and how to maximize success  Total lbs lost to date: 16 lbs Total weight loss percentage to date: 6.15%    Recommended Dietary Goals Brandi Davies is currently in the action stage of change. As such, her goal is to continue weight management plan.  She has agreed to: continue current plan   Behavioral Intervention We discussed the following today: continue to work on maintaining a reduced calorie state, getting the recommended amount of protein, incorporating whole foods, making healthy choices, staying well hydrated and practicing mindfulness when eating.  Additional resources provided today: None  Evidence-based interventions for health behavior change were utilized today including the discussion of self monitoring techniques, problem-solving barriers and SMART goal setting techniques.   Regarding patient's less desirable eating habits and patterns, we employed the technique of small changes.   Pt will specifically work on: n/a   Recommended Physical  Activity Goals Brandi Davies has been advised to work up to 300-450 minutes of moderate intensity aerobic activity a week and strengthening exercises 2-3 times per week for cardiovascular health, weight loss maintenance and preservation of muscle mass.   She has agreed to walk 30 minutes 3 days per week.    Pharmacotherapy We both agreed to Continue with current nutritional and behavioral strategies   ASSOCIATED CONDITIONS ADDRESSED TODAY:  Prediabetes Assessment & Plan: Lab Results  Component Value Date   HGBA1C 5.8 (H) 08/05/2023   INSULIN  18.6 08/05/2023    Diet/lifestyle approach. Her hunger and cravings are controlled the majority of the time. Continue working on nutrition plan to decrease simple carbohydrates, increase lean proteins and exercise to promote weight loss and improve glycemic control and prevent progression to T2DM. Recheck A1c next OV.    Vitamin D  deficiency Assessment & Plan: Lab Results  Component Value Date   VD25OH 25.2 (L) 08/05/2023   Pt doing well on ergocalciferol  50,000 units wkly. Continue regimen. Recheck levels next OV.    Follow up:   Return 12/29/2023 at 10:00 AM. She was informed of the importance of frequent follow up visits to maximize her success with intensive lifestyle modifications for her multiple health conditions.  Subjective:   Chief complaint: Obesity Brandi Davies is here to discuss her progress with her obesity treatment plan. She is keeping a food journal with a target of 1550-1650 calories and 115+ grams protein per day w/ the CAT 3 MP and only 100 snack calories as a guide  and states she is following her eating plan  approximately 70% of the time. She states she is not exercising.   Interval History:  Brandi Davies is here for a follow up office visit. Since last OV on 10/08/2023, Brandi Davies is down 2 lbs. Was pretty stressed with work this past 1.5 months. The stress is now  less as she's leaving her current job and will be starting a new  position very soon. Food wise, she still adhered to her protein goals and worked on getting in more veggies and fruits. Occasionally she eats off plan foods like ice-cream.   Pharmacotherapy for weight loss: none   Review of Systems:  Pertinent positives were addressed with patient today.  Reviewed by clinician on day of visit: allergies, medications, problem list, medical history, surgical history, family history, social history, and previous encounter notes.  Weight Summary and Biometrics   Weight Lost Since Last Visit: 2lb  Weight Gained Since Last Visit: 0lb   Vitals Temp: 98.8 F (37.1 C) BP: 126/87 Pulse Rate: 93 SpO2: 98 %   Anthropometric Measurements Height: 5' 7 (1.702 m) Weight: 244 lb (110.7 kg) BMI (Calculated): 38.21 Weight at Last Visit: 246lb Weight Lost Since Last Visit: 2lb Weight Gained Since Last Visit: 0lb Starting Weight: 260lb Total Weight Loss (lbs): 16 lb (7.258 kg) Peak Weight: 261lb   Body Composition  Body Fat %: 40.7 % Fat Mass (lbs): 99.4 lbs Muscle Mass (lbs): 137.6 lbs Total Body Water  (lbs): 95 lbs Visceral Fat Rating : 10   Other Clinical Data Fasting: No Labs: No Today's Visit #: 5 Starting Date: 08/05/23   Objective:   PHYSICAL EXAM: Blood pressure 126/87, pulse 93, temperature 98.8 F (37.1 C), height 5' 7 (1.702 m), weight 244 lb (110.7 kg), SpO2 98%, unknown if currently breastfeeding. Body mass index is 38.22 kg/m.  General: she is overweight, cooperative and in no acute distress. PSYCH: Has normal mood, affect and thought process.   HEENT: EOMI, sclerae are anicteric. Lungs: Normal breathing effort, no conversational dyspnea. Extremities: Moves * 4 Neurologic: A and O * 3, good insight  DIAGNOSTIC DATA REVIEWED: BMET    Component Value Date/Time   NA 140 08/05/2023 1048   K 4.6 08/05/2023 1048   CL 102 08/05/2023 1048   CO2 25 08/05/2023 1048   GLUCOSE 94 08/05/2023 1048   GLUCOSE 96 01/19/2021 1925    BUN 12 08/05/2023 1048   CREATININE 0.78 08/05/2023 1048   CALCIUM  9.8 08/05/2023 1048   GFRNONAA >60 01/19/2021 1925   GFRAA 134 08/19/2019 0918   Lab Results  Component Value Date   HGBA1C 5.8 (H) 08/05/2023   Lab Results  Component Value Date   INSULIN  18.6 08/05/2023   Lab Results  Component Value Date   TSH 1.150 08/05/2023   CBC    Component Value Date/Time   WBC 8.4 08/05/2023 1048   WBC 10.8 (H) 01/19/2021 1805   RBC 4.45 08/05/2023 1048   RBC 4.13 01/19/2021 1805   HGB 13.9 08/05/2023 1048   HCT 41.9 08/05/2023 1048   PLT 221 08/05/2023 1048   MCV 94 08/05/2023 1048   MCH 31.2 08/05/2023 1048   MCH 30.8 01/19/2021 1805   MCHC 33.2 08/05/2023 1048   MCHC 33.3 01/19/2021 1805   RDW 12.4 08/05/2023 1048   Iron Studies No results found for: IRON, TIBC, FERRITIN, IRONPCTSAT Lipid Panel     Component Value Date/Time   CHOL 201 (H) 08/05/2023 1048   TRIG 94 08/05/2023 1048   HDL 51 08/05/2023 1048  CHOLHDL 3.9 08/05/2023 1048   LDLCALC 133 (H) 08/05/2023 1048   Hepatic Function Panel     Component Value Date/Time   PROT 7.1 08/05/2023 1048   ALBUMIN 4.6 08/05/2023 1048   AST 21 08/05/2023 1048   ALT 38 (H) 08/05/2023 1048   ALKPHOS 43 (L) 08/05/2023 1048   BILITOT 0.3 08/05/2023 1048      Component Value Date/Time   TSH 1.150 08/05/2023 1048   Nutritional Lab Results  Component Value Date   VD25OH 25.2 (L) 08/05/2023    Attestations:   I, Brandi Davies , acting as a Stage manager for [employer], DO., have compiled all relevant documentation for today's office visit on behalf of Marceil Sensor, DO, while in the presence of [employer], DO.  I have reviewed the above documentation for accuracy and completeness, and I agree with the above. Brandi Davies, D.O.  The 21st Century Cures Act was signed into law in 2016 which includes the topic of electronic health records.  This provides immediate access to information in  MyChart.  This includes consultation notes, operative notes, office notes, lab results and pathology reports.  If you have any questions about what you read please let us  know at your next visit so we can discuss your concerns and take corrective action if need be.  We are right here with you.

## 2023-12-29 ENCOUNTER — Ambulatory Visit (INDEPENDENT_AMBULATORY_CARE_PROVIDER_SITE_OTHER): Admitting: Family Medicine

## 2023-12-29 ENCOUNTER — Encounter (INDEPENDENT_AMBULATORY_CARE_PROVIDER_SITE_OTHER): Payer: Self-pay | Admitting: Family Medicine

## 2023-12-29 VITALS — BP 136/84 | HR 77 | Temp 98.1°F | Ht 67.0 in | Wt 244.0 lb

## 2023-12-29 DIAGNOSIS — R748 Abnormal levels of other serum enzymes: Secondary | ICD-10-CM | POA: Diagnosis not present

## 2023-12-29 DIAGNOSIS — E559 Vitamin D deficiency, unspecified: Secondary | ICD-10-CM | POA: Diagnosis not present

## 2023-12-29 DIAGNOSIS — Z6838 Body mass index (BMI) 38.0-38.9, adult: Secondary | ICD-10-CM

## 2023-12-29 DIAGNOSIS — E669 Obesity, unspecified: Secondary | ICD-10-CM

## 2023-12-29 DIAGNOSIS — R7303 Prediabetes: Secondary | ICD-10-CM

## 2023-12-29 NOTE — Progress Notes (Signed)
 Brandi Davies, D.O.  ABFM, ABOM Specializing in Clinical Bariatric Medicine  Office located at: [address] [place], [place]  [address]   Assessment and Plan:   Orders Placed This Encounter  Procedures   Comprehensive metabolic panel with GFR   Hemoglobin A1c   VITAMIN D  25 Hydroxy (Vit-D Deficiency, Fractures)   Vitamin B12    FOR THE DISEASE OF OBESITY:  BMI 38.0-38.9 Obesity Beginning BMI 40.8 Assessment & Plan: Since last office visit on 11/17/2023 patient's muscle mass has decreased by 6.8 lbs. Fat mass has increased by 7.6 lbs. Total body water  has decreased by 1.6 lbs.  Counseling done on how various foods will affect these numbers and how to maximize success  Total lbs lost to date: - 16 lbs Total weight loss percentage to date: -6.15 %   Recommended Dietary Goals Brandi Davies is currently in the action stage of change. As such, her goal is to continue weight management plan.  She has agreed to: continue current plan   Behavioral Intervention We discussed the following today: healthier pasta alternatives, increasing lean protein intake to established goals and work on meal planning and preparation  Additional resources provided today: None  Evidence-based interventions for health behavior change were utilized today including the discussion of self monitoring techniques, problem-solving barriers and SMART goal setting techniques.   Regarding patient's less desirable eating habits and patterns, we employed the technique of small changes.   Pt will work on meal planning and keeping up with her exercise.    Recommended Physical Activity Goals Brandi Davies has been advised to work up to 300-450 minutes of moderate intensity aerobic activity a week and strengthening exercises 2-3 times per week for cardiovascular health, weight loss maintenance and preservation of muscle mass.   She may continue to gradually increase the amount and intensity of exercise  routine   Pharmacotherapy Continue with current nutritional and behavioral strategies   ASSOCIATED CONDITIONS ADDRESSED TODAY:   Prediabetes Assessment & Plan: Lab Results  Component Value Date   HGBA1C 5.8 (H) 08/05/2023   INSULIN  18.6 08/05/2023    Managed with diet and life-style interventions. Her hunger and cravings are controlled when following her prudent nutritional plan. No acute concerns.   - Reminded patient that having adequate amounts of protein with each meal is important for increasing muscle mass, stabilizing sugars, controlling hunger and cravings, and improving thermogenesis.  - Continue working on nutrition plan to decrease simple carbohydrates, increase lean proteins and exercise to promote weight loss and improve glycemic control and prevent progression to T2DM. - Recheck Hemoglobin A1c today.     Elevated liver enzymes Assessment & Plan:    Component Value Date/Time   PROT 7.1 08/05/2023 1048   ALBUMIN 4.6 08/05/2023 1048   AST 21 08/05/2023 1048   ALT 38 (H) 08/05/2023 1048   ALKPHOS 43 (L) 08/05/2023 1048   BILITOT 0.3 08/05/2023 1048    She has a history of elevated ALT and ALKPHOS noted on 08/05/2023.   - Continue reducing saturated fats as well as simple and added sugars in their diet.Also avoidance of alcohol.  - Losing 10% of adipose tissue may improve condition.  - Recheck liver enzymes today.     Vitamin D  deficiency Assessment & Plan: Lab Results  Component Value Date   VD25OH 25.2 (L) 08/05/2023   Currently on Ergocalciferol  50,000 units every 7 days without any adverse effects such as nausea, vomiting or muscle weakness. No acute concerns.   -  Continue vitamin D  supplementation and weight loss efforts.  - Recheck levels today.     Follow up:   Return 02/02/2024 at 4:00 PM.  She was informed of the importance of frequent follow up visits to maximize her success with intensive lifestyle modifications for her multiple health  conditions.  Brandi Davies is aware that we will review all of her lab results at our next visit together in person.  She is aware that if anything is critical/ life threatening with the results, we will be contacting her via MyChart or by my CMA will be calling them prior to the office visit to discuss acute management.      Subjective:   Chief complaint: Obesity Brandi Davies is here to discuss her progress with her obesity treatment plan. She is keeping a food journal with a target of 1550-1650 calories and 115+ grams protein per day w/ the CAT 3 MP and only 100 snack calories as a guide and states she is following her eating plan approximately 85-90% of the time. She states she is walking on the treadmill  45 minutes 3 days per week.   Interval History:  Brandi Davies is here for a follow up office visit. Since last OV on 11/17/2023 , her weight has not changed. Was in [facility] for 2 weeks and endorses eating off-plan foods there. Since returning, she feels that she is hitting her protein goals although she is not journaling. 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    Pharmacotherapy that aid with weight loss: none   Review of Systems:  Pertinent positives were addressed with patient today.  Reviewed by clinician on day of visit: allergies, medications, problem list, medical history, surgical history, family history, social history, and previous encounter notes.   Weight Summary and Biometrics   Weight Lost Since Last Visit: 0  Weight Gained Since Last Visit: 0  Vitals Temp: 98.1 F (36.7 C) BP: 136/84 Pulse Rate: 77 SpO2: 98 %   Anthropometric Measurements Height: 5' 7 (1.702 m) Weight: 244 lb (110.7 kg) BMI (Calculated): 38.21 Weight at Last Visit: 244 lb Weight Lost Since Last Visit: 0 Weight Gained Since Last Visit: 0 Starting Weight: 260 lb Total Weight Loss (lbs): 16 lb (7.258 kg)   Body Composition  Body Fat %:  43.7 % Fat Mass (lbs): 107 lbs Muscle Mass (lbs): 130.8 lbs Total Body Water  (lbs): 93.4 lbs Visceral Fat Rating : 11   Other Clinical Data Fasting: no Labs: no Today's Visit #: 6 Starting Date: 08/05/23 Comments: Cat 3    Objective:   PHYSICAL EXAM: Blood pressure 136/84, pulse 77, temperature 98.1 F (36.7 C), height 5' 7 (1.702 m), weight 244 lb (110.7 kg), SpO2 98%, unknown if currently breastfeeding. Body mass index is 38.22 kg/m.  General: she is overweight, cooperative and in no acute distress. PSYCH: Has normal mood, affect and thought process.   HEENT: EOMI, sclerae are anicteric. Lungs: Normal breathing effort, no conversational dyspnea. Extremities: Moves * 4 Neurologic: A and O * 3, good insight  DIAGNOSTIC DATA REVIEWED: BMET    Component Value Date/Time   NA 140 08/05/2023 1048   K 4.6 08/05/2023 1048   CL 102 08/05/2023 1048   CO2 25 08/05/2023 1048   GLUCOSE 94 08/05/2023 1048   GLUCOSE 96 01/19/2021 1925   BUN 12 08/05/2023 1048   CREATININE 0.78 08/05/2023 1048   CALCIUM  9.8 08/05/2023 1048  GFRNONAA >60 01/19/2021 1925   GFRAA 134 08/19/2019 0918   Lab Results  Component Value Date   HGBA1C 5.8 (H) 08/05/2023   Lab Results  Component Value Date   INSULIN  18.6 08/05/2023   Lab Results  Component Value Date   TSH 1.150 08/05/2023   CBC    Component Value Date/Time   WBC 8.4 08/05/2023 1048   WBC 10.8 (H) 01/19/2021 1805   RBC 4.45 08/05/2023 1048   RBC 4.13 01/19/2021 1805   HGB 13.9 08/05/2023 1048   HCT 41.9 08/05/2023 1048   PLT 221 08/05/2023 1048   MCV 94 08/05/2023 1048   MCH 31.2 08/05/2023 1048   MCH 30.8 01/19/2021 1805   MCHC 33.2 08/05/2023 1048   MCHC 33.3 01/19/2021 1805   RDW 12.4 08/05/2023 1048   Iron Studies No results found for: IRON, TIBC, FERRITIN, IRONPCTSAT Lipid Panel     Component Value Date/Time   CHOL 201 (H) 08/05/2023 1048   TRIG 94 08/05/2023 1048   HDL 51 08/05/2023 1048    CHOLHDL 3.9 08/05/2023 1048   LDLCALC 133 (H) 08/05/2023 1048   Hepatic Function Panel     Component Value Date/Time   PROT 7.1 08/05/2023 1048   ALBUMIN 4.6 08/05/2023 1048   AST 21 08/05/2023 1048   ALT 38 (H) 08/05/2023 1048   ALKPHOS 43 (L) 08/05/2023 1048   BILITOT 0.3 08/05/2023 1048      Component Value Date/Time   TSH 1.150 08/05/2023 1048   Nutritional Lab Results  Component Value Date   VD25OH 25.2 (L) 08/05/2023    Attestations:   I, Special Puri, acting as a Stage manager for [employer], DO., have compiled all relevant documentation for today's office visit on behalf of Brandi Jenkins, DO, while in the presence of [employer], DO.  I have reviewed the above documentation for accuracy and completeness, and I agree with the above. Brandi Davies, D.O.  The 21st Century Cures Act was signed into law in 2016 which includes the topic of electronic health records.  This provides immediate access to information in MyChart. This includes consultation notes, operative notes, office notes, lab results and pathology reports.  If you have any questions about what you read please let us  know at your next visit so we can discuss your concerns and take corrective action if need be.  We are right here with you.

## 2023-12-30 LAB — COMPREHENSIVE METABOLIC PANEL WITH GFR
ALT: 34 IU/L — ABNORMAL HIGH (ref 0–32)
AST: 21 IU/L (ref 0–40)
Albumin: 4.5 g/dL (ref 3.9–4.9)
Alkaline Phosphatase: 44 IU/L (ref 44–121)
BUN/Creatinine Ratio: 21 (ref 9–23)
BUN: 20 mg/dL (ref 6–20)
Bilirubin Total: 0.3 mg/dL (ref 0.0–1.2)
CO2: 20 mmol/L (ref 20–29)
Calcium: 9.6 mg/dL (ref 8.7–10.2)
Chloride: 101 mmol/L (ref 96–106)
Creatinine, Ser: 0.95 mg/dL (ref 0.57–1.00)
Globulin, Total: 2.5 g/dL (ref 1.5–4.5)
Glucose: 90 mg/dL (ref 70–99)
Potassium: 4.6 mmol/L (ref 3.5–5.2)
Sodium: 137 mmol/L (ref 134–144)
Total Protein: 7 g/dL (ref 6.0–8.5)
eGFR: 80 mL/min/1.73 (ref >59)

## 2023-12-30 LAB — VITAMIN B12: Vitamin B-12: 347 pg/mL (ref 232–1245)

## 2023-12-30 LAB — VITAMIN D 25 HYDROXY (VIT D DEFICIENCY, FRACTURES): Vit D, 25-Hydroxy: 47.5 ng/mL (ref 30.0–100.0)

## 2023-12-30 LAB — HEMOGLOBIN A1C
Est. average glucose Bld gHb Est-mCnc: 114 mg/dL
Hgb A1c MFr Bld: 5.6 % (ref 4.8–5.6)

## 2024-01-18 ENCOUNTER — Other Ambulatory Visit (INDEPENDENT_AMBULATORY_CARE_PROVIDER_SITE_OTHER): Payer: Self-pay | Admitting: Family Medicine

## 2024-01-18 DIAGNOSIS — E559 Vitamin D deficiency, unspecified: Secondary | ICD-10-CM

## 2024-02-02 ENCOUNTER — Encounter (INDEPENDENT_AMBULATORY_CARE_PROVIDER_SITE_OTHER): Payer: Self-pay | Admitting: Family Medicine

## 2024-02-02 ENCOUNTER — Ambulatory Visit (INDEPENDENT_AMBULATORY_CARE_PROVIDER_SITE_OTHER): Admitting: Family Medicine

## 2024-02-02 VITALS — BP 137/94 | HR 108 | Temp 98.0°F | Ht 67.0 in | Wt 243.0 lb

## 2024-02-02 DIAGNOSIS — E538 Deficiency of other specified B group vitamins: Secondary | ICD-10-CM | POA: Diagnosis not present

## 2024-02-02 DIAGNOSIS — E669 Obesity, unspecified: Secondary | ICD-10-CM

## 2024-02-02 DIAGNOSIS — R7303 Prediabetes: Secondary | ICD-10-CM | POA: Diagnosis not present

## 2024-02-02 DIAGNOSIS — Z6841 Body Mass Index (BMI) 40.0 and over, adult: Secondary | ICD-10-CM

## 2024-02-02 DIAGNOSIS — R748 Abnormal levels of other serum enzymes: Secondary | ICD-10-CM

## 2024-02-02 DIAGNOSIS — E559 Vitamin D deficiency, unspecified: Secondary | ICD-10-CM | POA: Diagnosis not present

## 2024-02-02 DIAGNOSIS — Z6838 Body mass index (BMI) 38.0-38.9, adult: Secondary | ICD-10-CM

## 2024-02-02 MED ORDER — CYANOCOBALAMIN 500 MCG PO TABS: 500.0000 ug | ORAL_TABLET | Freq: Every day | ORAL | Status: AC

## 2024-02-02 MED ORDER — VITAMIN D (ERGOCALCIFEROL) 1.25 MG (50000 UNIT) PO CAPS: 50000.0000 [IU] | ORAL_CAPSULE | ORAL | 0 refills | Status: AC

## 2024-02-05 NOTE — Progress Notes (Signed)
 Brandi Davies, D.O.  ABFM, ABOM Specializing in Clinical Bariatric Medicine  Office located at: [address] [place], [place]  [address]   Assessment and Plan:  No orders of the defined types were placed in this encounter.   Medications Discontinued During This Encounter  Medication Reason   Vitamin D , Ergocalciferol , (DRISDOL ) 1.25 MG (50000 UNIT) CAPS capsule Reorder     Meds ordered this encounter  Medications   cyanocobalamin  (VITAMIN B12) 500 MCG tablet    Sig: Take 1 tablet (500 mcg total) by mouth daily.   Vitamin D , Ergocalciferol , (DRISDOL ) 1.25 MG (50000 UNIT) CAPS capsule    Sig: Take 1 capsule (50,000 Units total) by mouth every 7 (seven) days.    Dispense:  12 capsule    Refill:  0      FOR THE DISEASE OF OBESITY:  *** Assessment & Plan: Since last office visit on *** patient's muscle mass has {DID:29233} by ***lbs. Fat mass has {DID:29233} by ***lbs. Total body water  has {DID:29233} by ***lbs.  Body fat % has {DID:29233} by *** %. Counseling done on how various foods will affect these numbers and how to maximize success  Total lbs lost to date: *** lbs Total weight loss percentage to date: *** %   Recommended Dietary Goals Brandi Davies is currently in the action stage of change. As such, her goal is to continue weight management plan.  She has agreed to: {EMWTLOSSPLAN:29297::continue current plan}   Behavioral Intervention We discussed the following today: {dowtlossstrategies:31654}  Additional resources provided today: {DOhandouts:31655::None}  Evidence-based interventions for health behavior change were utilized today including the discussion of self monitoring techniques, problem-solving barriers and SMART goal setting techniques.   Regarding patient's less desirable eating habits and patterns, we employed the technique of small changes.   Pt will specifically work on: ***    Recommended Physical Activity Goals Brandi Davies has been advised to  work up to 300-450 minutes of moderate intensity aerobic activity a week and strengthening exercises 2-3 times per week for cardiovascular health, weight loss maintenance and preservation of muscle mass.   She has agreed to: {EMEXERCISE:28847::Think about enjoyable ways to increase daily physical activity and overcoming barriers to exercise,Increase physical activity in their day and reduce sedentary time (increase NEAT).,Increase volume of physical activity to a goal of 240 minutes a week,Combine aerobic and strengthening exercises for efficiency and improved cardiometabolic health.}   Pharmacotherapy We both agreed to: {EMagreedrx:29170}   ASSOCIATED CONDITIONS ADDRESSED TODAY:  ***   Follow up:   No follow-ups on file. *** She was informed of the importance of frequent follow up visits to maximize her success with intensive lifestyle modifications for her multiple health conditions.   Subjective:   Chief complaint: Obesity Brandi Davies is here to discuss her progress with her obesity treatment plan. She is on {MWMwtlossportion/plan2:23431} and states she is following her eating plan approximately ***% of the time. She states she is *** minutes *** days per week OR  *** not exercising (delete one)   Interval History:  Brandi Davies is here for a follow up office visit. Since last OV on *** , she is ***.       Pharmacotherapy that aid with weight loss: She is currently taking {EMPharmaco:28845}.    Review of Systems:  Pertinent positives were addressed with patient today.  Reviewed by clinician on day of visit: allergies, medications, problem list, medical history, surgical history, family history, social history, and previous encounter notes.  Weight Summary and  Biometrics   No data recorded No data recorded ***  No data recorded No data recorded No data recorded No data recorded  Objective:   PHYSICAL EXAM: Blood pressure (!) 137/94, pulse (!) 108, temperature 98  F (36.7 C), height 5' 7 (1.702 m), weight 243 lb (110.2 kg), SpO2 100%, unknown if currently breastfeeding. Body mass index is 38.06 kg/m.  General: she is overweight, cooperative and in no acute distress. PSYCH: Has normal mood, affect and thought process.   HEENT: EOMI, sclerae are anicteric. Lungs: Normal breathing effort, no conversational dyspnea. Extremities: Moves * 4 Neurologic: A and O * 3, good insight  DIAGNOSTIC DATA REVIEWED: BMET    Component Value Date/Time   NA 137 12/29/2023 1031   K 4.6 12/29/2023 1031   CL 101 12/29/2023 1031   CO2 20 12/29/2023 1031   GLUCOSE 90 12/29/2023 1031   GLUCOSE 96 01/19/2021 1925   BUN 20 12/29/2023 1031   CREATININE 0.95 12/29/2023 1031   CALCIUM  9.6 12/29/2023 1031   GFRNONAA >60 01/19/2021 1925   GFRAA 134 08/19/2019 0918   Lab Results  Component Value Date   HGBA1C 5.6 12/29/2023   HGBA1C 5.8 (H) 08/05/2023   Lab Results  Component Value Date   INSULIN  18.6 08/05/2023   Lab Results  Component Value Date   TSH 1.150 08/05/2023   CBC    Component Value Date/Time   WBC 8.4 08/05/2023 1048   WBC 10.8 (H) 01/19/2021 1805   RBC 4.45 08/05/2023 1048   RBC 4.13 01/19/2021 1805   HGB 13.9 08/05/2023 1048   HCT 41.9 08/05/2023 1048   PLT 221 08/05/2023 1048   MCV 94 08/05/2023 1048   MCH 31.2 08/05/2023 1048   MCH 30.8 01/19/2021 1805   MCHC 33.2 08/05/2023 1048   MCHC 33.3 01/19/2021 1805   RDW 12.4 08/05/2023 1048   Iron Studies No results found for: IRON, TIBC, FERRITIN, IRONPCTSAT Lipid Panel     Component Value Date/Time   CHOL 201 (H) 08/05/2023 1048   TRIG 94 08/05/2023 1048   HDL 51 08/05/2023 1048   CHOLHDL 3.9 08/05/2023 1048   LDLCALC 133 (H) 08/05/2023 1048   Hepatic Function Panel     Component Value Date/Time   PROT 7.0 12/29/2023 1031   ALBUMIN 4.5 12/29/2023 1031   AST 21 12/29/2023 1031   ALT 34 (H) 12/29/2023 1031   ALKPHOS 44 12/29/2023 1031   BILITOT 0.3 12/29/2023 1031       Component Value Date/Time   TSH 1.150 08/05/2023 1048   Nutritional Lab Results  Component Value Date   VD25OH 47.5 12/29/2023   VD25OH 25.2 (L) 08/05/2023    Attestations:   I, ***, acting as a medical scribe for Brandi Jenkins, DO., have compiled all relevant documentation for today's office visit on behalf of Brandi Jenkins, DO, while in the presence of [employer], DO.  Reviewed by clinician on day of visit: allergies, medications, problem list, medical history, surgical history, family history, social history, and previous encounter notes pertinent to patient's obesity diagnosis.  I have spent *** minutes in the care of the patient today including *** minutes face-to-face assessing and reviewing listed medical problems above as outlined in office visit note and providing nutritional and behavioral counseling as outlined in obesity care plan.   I have reviewed the above documentation for accuracy and completeness, and I agree with the above. Brandi JINNY Davies, D.O.  The 21st Century Cures Act was signed into law in  2016 which includes the topic of electronic health records.  This provides immediate access to information in MyChart.  This includes consultation notes, operative notes, office notes, lab results and pathology reports.  If you have any questions about what you read please let us  know at your next visit so we can discuss your concerns and take corrective action if need be.  We are right here with you.

## 2024-02-10 ENCOUNTER — Other Ambulatory Visit: Payer: Self-pay | Admitting: Medical Genetics

## 2024-02-22 DIAGNOSIS — E559 Vitamin D deficiency, unspecified: Secondary | ICD-10-CM | POA: Insufficient documentation

## 2024-02-22 DIAGNOSIS — R7303 Prediabetes: Secondary | ICD-10-CM | POA: Insufficient documentation

## 2024-02-22 DIAGNOSIS — E782 Mixed hyperlipidemia: Secondary | ICD-10-CM | POA: Insufficient documentation

## 2024-02-22 NOTE — Progress Notes (Signed)
 New Patient Visit  Subjective:     Patient ID: Brandi Davies, female    DOB: May 23, 1986, 38 y.o.   MRN: [id number]  Chief Complaint  Patient presents with   Establish Care    HPI  Discussed the use of AI scribe software for clinical note transcription with the patient, who gave verbal consent to proceed.  History of Present Illness Brandi Davies is a 38 year old female who presents for establishment of care and concerns about scalp psoriasis and postnasal drip.  Scalp dermatosis - Crescent-shaped area of involvement on the scalp - Daily hair washing due to greasiness - Temporary relief with over-the-counter treatments including Head & Shoulders, T/Sal, and L'Oreal scalp detoxer with salicylic acid  Upper respiratory symptoms - Postnasal drip for several days - Mouth breathing at night - Waking with sore throat - Persistent congestion and sensation of 'crud' in the throat despite Zyrtec and recent initiation of Flonase - Ear congestion and soreness - No recent open blisters or fever  Recent viral illness - Recent recovery from hand, foot, and mouth disease - Lingering sore throat and fatigue following illness - No history of mononucleosis  Covid-19 history - Prior COVID-19 infection and vaccination - Vaccine side effects were more severe than the illness itself  Appetite and medication effectiveness - No food intake today - Eating improves effectiveness of ADD medication     ROS Per HPI  Outpatient Encounter Medications as of 02/24/2024  Medication Sig   albuterol  (PROVENTIL  HFA;VENTOLIN  HFA) 108 (90 Base) MCG/ACT inhaler Inhale 2 puffs into the lungs every 6 (six) hours as needed for wheezing or shortness of breath.   amoxicillin -clavulanate (AUGMENTIN ) 875-125 MG tablet Take 1 tablet by mouth 2 (two) times daily.   busPIRone (BUSPAR) 10 MG tablet Take 10 mg by mouth 2 (two) times daily.   busPIRone (BUSPAR) 30 MG tablet    cetirizine (ZYRTEC) 10 MG tablet Take by  mouth.   cyanocobalamin  (VITAMIN B12) 500 MCG tablet Take 1 tablet (500 mcg total) by mouth daily.   Drospirenone  (SLYND  PO)    hydrOXYzine (VISTARIL) 25 MG capsule Take 25 mg by mouth daily as needed.   hydrOXYzine (VISTARIL) 25 MG/ML injection    ipratropium (ATROVENT) 0.03 % nasal spray Place 2 sprays into both nostrils 2 (two) times daily.   [START ON 02/26/2024] ketoconazole  (NIZORAL ) 2 % shampoo Apply 1 Application topically 2 (two) times a week.   naltrexone (DEPADE) 50 MG tablet    QELBREE 200 MG 24 hr capsule Take 400 mg by mouth daily.   SLYND  4 MG TABS Take 1 tablet by mouth daily.   VIIBRYD 40 MG TABS    Vitamin D , Ergocalciferol , (DRISDOL ) 1.25 MG (50000 UNIT) CAPS capsule Take 1 capsule (50,000 Units total) by mouth every 7 (seven) days.   [DISCONTINUED] disulfiram (ANTABUSE) 250 MG tablet Take 250 mg by mouth daily.   No facility-administered encounter medications on file as of 02/24/2024.    Past Medical History:  Diagnosis Date   Abnormal Pap smear of cervix    ADD (attention deficit disorder)    Alcohol abuse    Anxiety    Anxiety 05/26/2018   Asthma    Asthma, chronic 05/26/2018   Constipation    Depression    GERD (gastroesophageal reflux disease)    High blood pressure    Obesity    Pre-diabetes     Past Surgical History:  Procedure Laterality Date   CESAREAN SECTION N/A 05/26/2018  Procedure: CESAREAN SECTION;  Surgeon: Barbette Knock, MD;  Location: [facility];  Service: Obstetrics;  Laterality: N/A;   CESAREAN SECTION N/A 01/12/2021   Procedure: REPEAT CESAREAN SECTION;  Surgeon: Kandyce Sor, MD;  Location: [facility] LD ORS;  Service: Obstetrics;  Laterality: N/A;   LEEP  2017   MOUTH SURGERY     WISDOM TOOTH EXTRACTION      Family History  Problem Relation Age of Onset   Depression Mother    Anxiety disorder Mother    Liver disease Mother    Alcoholism Mother    Drug abuse Mother    Alcohol abuse Mother    Stomach cancer Maternal  Grandmother    Stomach cancer Maternal Grandfather    Stroke Paternal Grandmother    Heart disease Paternal Grandmother    Heart attack Paternal Grandmother    Heart attack Paternal Grandfather    Cancer Maternal Aunt     Social History   Socioeconomic History   Marital status: Married    Spouse name: Not on file   Number of children: Not on file   Years of education: Not on file   Highest education level: Master's degree (e.g., MA, MS, MEng, MEd, MSW, MBA)  Occupational History   Not on file  Tobacco Use   Smoking status: Never   Smokeless tobacco: Never  Vaping Use   Vaping status: Never Used  Substance and Sexual Activity   Alcohol use: Yes    Alcohol/week: 2.0 - 3.0 standard drinks of alcohol    Types: 2 - 3 Standard drinks or equivalent per week   Drug use: No   Sexual activity: Yes    Partners: Male    Birth control/protection: Pill  Other Topics Concern   Not on file  Social History Narrative   Not on file   Social Drivers of Health   Financial Resource Strain: Low Risk  (02/20/2024)   Overall Financial Resource Strain (CARDIA)    Difficulty of Paying Living Expenses: Not very hard  Food Insecurity: No Food Insecurity (02/20/2024)   Hunger Vital Sign    Worried About Running Out of Food in the Last Year: Never true    Ran Out of Food in the Last Year: Never true  Transportation Needs: No Transportation Needs (02/20/2024)   PRAPARE - Administrator, Civil Service (Medical): No    Lack of Transportation (Non-Medical): No  Physical Activity: Insufficiently Active (02/20/2024)   Exercise Vital Sign    Days of Exercise per Week: 3 days    Minutes of Exercise per Session: 40 min  Stress: Stress Concern Present (02/20/2024)   [employer] of Occupational Health - Occupational Stress Questionnaire    Feeling of Stress: To some extent  Social Connections: Moderately Isolated (02/20/2024)   Social Connection and Isolation Panel    Frequency of  Communication with Friends and Family: More than three times a week    Frequency of Social Gatherings with Friends and Family: Once a week    Attends Religious Services: Patient declined    Database administrator or Organizations: No    Attends Engineer, structural: Not on file    Marital Status: Married  Catering manager Violence: Not on file       Objective:    BP 132/86 (BP Location: Left Arm, Patient Position: Sitting)   Pulse (!) 102   Temp 98.3 F (36.8 C) (Temporal)   Ht 5' 7 (1.702 m)   Wt 246  lb 3.2 oz (111.7 kg)   SpO2 99%   Breastfeeding No   BMI 38.56 kg/m    Physical Exam Vitals and nursing note reviewed.  Constitutional:      General: She is not in acute distress.    Appearance: She is obese.     Comments: Appears fatigued   HENT:     Head: Normocephalic and atraumatic.     Right Ear: External ear normal. A middle ear effusion is present. Tympanic membrane is erythematous and bulging.     Left Ear: A middle ear effusion is present. Tympanic membrane is erythematous and bulging.     Nose: Nose normal.     Mouth/Throat:     Mouth: Mucous membranes are moist.     Pharynx: Posterior oropharyngeal erythema and postnasal drip present. No pharyngeal swelling, oropharyngeal exudate or uvula swelling.     Tonsils: No tonsillar exudate or tonsillar abscesses.  Eyes:     Extraocular Movements: Extraocular movements intact.     Pupils: Pupils are equal, round, and reactive to light.  Cardiovascular:     Rate and Rhythm: Normal rate and regular rhythm.     Pulses: Normal pulses.     Heart sounds: Normal heart sounds.  Pulmonary:     Effort: Pulmonary effort is normal. No respiratory distress.     Breath sounds: Normal breath sounds. No wheezing, rhonchi or rales.  Musculoskeletal:        General: Normal range of motion.     Cervical back: Normal range of motion.     Right lower leg: No edema.     Left lower leg: No edema.  Lymphadenopathy:      Cervical: No cervical adenopathy.  Neurological:     General: No focal deficit present.     Mental Status: She is alert and oriented to person, place, and time.  Psychiatric:        Mood and Affect: Mood normal.        Thought Content: Thought content normal.     Results for orders placed or performed in visit on 02/24/24  CBC with Differential/Platelet  Result Value Ref Range   WBC 6.6 4.0 - 10.5 K/uL   RBC 4.88 3.87 - 5.11 Mil/uL   Hemoglobin 14.9 12.0 - 15.0 g/dL   HCT 55.4 63.9 - 53.9 %   MCV 91.2 78.0 - 100.0 fl   MCHC 33.5 30.0 - 36.0 g/dL   RDW 86.5 88.4 - 84.4 %   Platelets 237.0 150.0 - 400.0 K/uL   Neutrophils Relative % 61.8 43.0 - 77.0 %   Lymphocytes Relative 30.9 12.0 - 46.0 %   Monocytes Relative 5.6 3.0 - 12.0 %   Eosinophils Relative 1.3 0.0 - 5.0 %   Basophils Relative 0.4 0.0 - 3.0 %   Neutro Abs 4.1 1.4 - 7.7 K/uL   Lymphs Abs 2.0 0.7 - 4.0 K/uL   Monocytes Absolute 0.4 0.1 - 1.0 K/uL   Eosinophils Absolute 0.1 0.0 - 0.7 K/uL   Basophils Absolute 0.0 0.0 - 0.1 K/uL  Comprehensive metabolic panel with GFR  Result Value Ref Range   Sodium 138 135 - 145 mEq/L   Potassium 4.5 3.5 - 5.1 mEq/L   Chloride 101 96 - 112 mEq/L   CO2 26 19 - 32 mEq/L   Glucose, Bld 113 (H) 70 - 99 mg/dL   BUN 19 6 - 23 mg/dL   Creatinine, Ser 9.09 0.40 - 1.20 mg/dL   Total Bilirubin 0.5 0.2 -  1.2 mg/dL   Alkaline Phosphatase 32 (L) 39 - 117 U/L   AST 21 0 - 37 U/L   ALT 36 (H) 0 - 35 U/L   Total Protein 7.7 6.0 - 8.3 g/dL   Albumin 4.8 3.5 - 5.2 g/dL   GFR 17.39 >39.99 mL/min   Calcium  9.9 8.4 - 10.5 mg/dL  Lipid panel  Result Value Ref Range   Cholesterol 163 0 - 200 mg/dL   Triglycerides 09.9 0.0 - 149.0 mg/dL   HDL 45.39 >60.99 mg/dL   VLDL 81.9 0.0 - 59.9 mg/dL   LDL Cholesterol 91 0 - 99 mg/dL   Total CHOL/HDL Ratio 3    NonHDL 108.70   Vitamin B12  Result Value Ref Range   Vitamin B-12 403 211 - 911 pg/mL  VITAMIN D  25 Hydroxy (Vit-D Deficiency, Fractures)   Result Value Ref Range   VITD 37.27 30.00 - 100.00 ng/mL        Assessment & Plan:   Assessment and Plan Assessment & Plan Non-recurrent acute otitis media of both ears without spontaneous rupture of tympanic membranes Bilateral ear congestion with redness suggests acute otitis media, likely bacterial. Throat redness noted, strep coverage considered due to susceptibility. - Prescribed Augmentin  875 mg twice daily for 10 days. Advised to take with food. - Advised probiotic or yogurt intake to maintain gut flora and prevent yeast infections.  Acute pharyngitis  With postnasal drip and upper respiratory symptoms Persistent sore throat with postnasal drip and upper respiratory symptoms. Differential includes viral causes; mono suspected due to throat redness. - Ordered lab tests including mono test. - Advised use of humidifier at night. - Continue Zyrtec and Flonase. - Advised against flu shot at this time.  Seborrheic Dermatitis Scalp dermatitis localized to a crescent area. Previous treatments provided limited relief, prescription-strength treatment initiated. - Prescribed ketoconazole  foam for application every other day initially, then twice a week as symptoms improve. - Advised to check pricing options at different pharmacies and consider GoodRx for cost savings.  ADHD - stable - continue Qelbree   Vit D deficiency - Vit D levels today  B12 Deficiency - B12 levels today  GAD - stable - refilled hydroxyzine and buspar  Mixed Hyperlipidemia - Fasting today, will draw lipids and treat accordingly  No flu vaccine today due to current illness    Orders Placed This Encounter  Procedures   CBC with Differential/Platelet    Release to patient:   Immediate [1]   Comprehensive metabolic panel with GFR    Release to patient:   Immediate [1]   Lipid panel   Vitamin B12   VITAMIN D  25 Hydroxy (Vit-D Deficiency, Fractures)   Mononucleosis screen     Meds ordered this  encounter  Medications   amoxicillin -clavulanate (AUGMENTIN ) 875-125 MG tablet    Sig: Take 1 tablet by mouth 2 (two) times daily.    Dispense:  20 tablet    Refill:  0   ketoconazole  (NIZORAL ) 2 % shampoo    Sig: Apply 1 Application topically 2 (two) times a week.    Dispense:  120 mL    Refill:  0    Return in about 6 months (around 08/23/2024) for med f/u.  Corean LITTIE Ku, FNP

## 2024-02-22 NOTE — Patient Instructions (Signed)

## 2024-02-24 ENCOUNTER — Ambulatory Visit: Admitting: Family Medicine

## 2024-02-24 ENCOUNTER — Ambulatory Visit (INDEPENDENT_AMBULATORY_CARE_PROVIDER_SITE_OTHER)

## 2024-02-24 ENCOUNTER — Encounter: Payer: Self-pay | Admitting: Family Medicine

## 2024-02-24 ENCOUNTER — Ambulatory Visit: Payer: Self-pay | Admitting: Family Medicine

## 2024-02-24 VITALS — BP 132/86 | HR 102 | Temp 98.3°F | Ht 67.0 in | Wt 246.2 lb

## 2024-02-24 DIAGNOSIS — L219 Seborrheic dermatitis, unspecified: Secondary | ICD-10-CM

## 2024-02-24 DIAGNOSIS — F1010 Alcohol abuse, uncomplicated: Secondary | ICD-10-CM

## 2024-02-24 DIAGNOSIS — E538 Deficiency of other specified B group vitamins: Secondary | ICD-10-CM

## 2024-02-24 DIAGNOSIS — F900 Attention-deficit hyperactivity disorder, predominantly inattentive type: Secondary | ICD-10-CM | POA: Diagnosis not present

## 2024-02-24 DIAGNOSIS — E782 Mixed hyperlipidemia: Secondary | ICD-10-CM

## 2024-02-24 DIAGNOSIS — E559 Vitamin D deficiency, unspecified: Secondary | ICD-10-CM | POA: Diagnosis not present

## 2024-02-24 DIAGNOSIS — F332 Major depressive disorder, recurrent severe without psychotic features: Secondary | ICD-10-CM | POA: Diagnosis not present

## 2024-02-24 DIAGNOSIS — R7303 Prediabetes: Secondary | ICD-10-CM

## 2024-02-24 DIAGNOSIS — J029 Acute pharyngitis, unspecified: Secondary | ICD-10-CM

## 2024-02-24 DIAGNOSIS — B341 Enterovirus infection, unspecified: Secondary | ICD-10-CM | POA: Insufficient documentation

## 2024-02-24 DIAGNOSIS — R7309 Other abnormal glucose: Secondary | ICD-10-CM | POA: Insufficient documentation

## 2024-02-24 DIAGNOSIS — F411 Generalized anxiety disorder: Secondary | ICD-10-CM

## 2024-02-24 DIAGNOSIS — H66003 Acute suppurative otitis media without spontaneous rupture of ear drum, bilateral: Secondary | ICD-10-CM

## 2024-02-24 DIAGNOSIS — F410 Panic disorder [episodic paroxysmal anxiety] without agoraphobia: Secondary | ICD-10-CM

## 2024-02-24 LAB — CBC WITH DIFFERENTIAL/PLATELET
Basophils Absolute: 0 K/uL (ref 0.0–0.1)
Basophils Relative: 0.4 % (ref 0.0–3.0)
Eosinophils Absolute: 0.1 K/uL (ref 0.0–0.7)
Eosinophils Relative: 1.3 % (ref 0.0–5.0)
HCT: 44.5 % (ref 36.0–46.0)
Hemoglobin: 14.9 g/dL (ref 12.0–15.0)
Lymphocytes Relative: 30.9 % (ref 12.0–46.0)
Lymphs Abs: 2 K/uL (ref 0.7–4.0)
MCHC: 33.5 g/dL (ref 30.0–36.0)
MCV: 91.2 fl (ref 78.0–100.0)
Monocytes Absolute: 0.4 K/uL (ref 0.1–1.0)
Monocytes Relative: 5.6 % (ref 3.0–12.0)
Neutro Abs: 4.1 K/uL (ref 1.4–7.7)
Neutrophils Relative %: 61.8 % (ref 43.0–77.0)
Platelets: 237 K/uL (ref 150.0–400.0)
RBC: 4.88 Mil/uL (ref 3.87–5.11)
RDW: 13.4 % (ref 11.5–15.5)
WBC: 6.6 K/uL (ref 4.0–10.5)

## 2024-02-24 LAB — LIPID PANEL
Cholesterol: 163 mg/dL (ref 0–200)
HDL: 54.6 mg/dL (ref >39.00)
LDL Cholesterol: 91 mg/dL (ref 0–99)
NonHDL: 108.7
Total CHOL/HDL Ratio: 3
Triglycerides: 90 mg/dL (ref 0.0–149.0)
VLDL: 18 mg/dL (ref 0.0–40.0)

## 2024-02-24 LAB — COMPREHENSIVE METABOLIC PANEL WITH GFR
ALT: 36 U/L — ABNORMAL HIGH (ref 0–35)
AST: 21 U/L (ref 0–37)
Albumin: 4.8 g/dL (ref 3.5–5.2)
Alkaline Phosphatase: 32 U/L — ABNORMAL LOW (ref 39–117)
BUN: 19 mg/dL (ref 6–23)
CO2: 26 meq/L (ref 19–32)
Calcium: 9.9 mg/dL (ref 8.4–10.5)
Chloride: 101 meq/L (ref 96–112)
Creatinine, Ser: 0.9 mg/dL (ref 0.40–1.20)
GFR: 82.6 mL/min (ref >60.00)
Glucose, Bld: 113 mg/dL — ABNORMAL HIGH (ref 70–99)
Potassium: 4.5 meq/L (ref 3.5–5.1)
Sodium: 138 meq/L (ref 135–145)
Total Bilirubin: 0.5 mg/dL (ref 0.2–1.2)
Total Protein: 7.7 g/dL (ref 6.0–8.3)

## 2024-02-24 LAB — HEMOGLOBIN A1C: Hgb A1c MFr Bld: 6.1 % (ref 4.6–6.5)

## 2024-02-24 LAB — VITAMIN B12: Vitamin B-12: 403 pg/mL (ref 211–911)

## 2024-02-24 LAB — VITAMIN D 25 HYDROXY (VIT D DEFICIENCY, FRACTURES): VITD: 37.27 ng/mL (ref 30.00–100.00)

## 2024-02-24 MED ORDER — KETOCONAZOLE 2 % EX SHAM: 1.0000 | MEDICATED_SHAMPOO | CUTANEOUS | 0 refills | Status: DC

## 2024-02-24 MED ORDER — AMOXICILLIN-POT CLAVULANATE 875-125 MG PO TABS: 1.0000 | ORAL_TABLET | Freq: Two times a day (BID) | ORAL | 0 refills | Status: DC

## 2024-02-25 LAB — MONONUCLEOSIS SCREEN: Heterophile, Mono Screen: NEGATIVE

## 2024-02-26 ENCOUNTER — Ambulatory Visit: Payer: Self-pay | Admitting: Family Medicine

## 2024-03-16 ENCOUNTER — Encounter: Payer: Self-pay | Admitting: Family Medicine

## 2024-03-23 ENCOUNTER — Telehealth: Payer: Self-pay | Admitting: Pharmacy Technician

## 2024-03-23 ENCOUNTER — Other Ambulatory Visit (HOSPITAL_COMMUNITY): Payer: Self-pay

## 2024-03-23 ENCOUNTER — Encounter: Payer: Self-pay | Admitting: Family Medicine

## 2024-03-23 ENCOUNTER — Ambulatory Visit: Admitting: Family Medicine

## 2024-03-23 ENCOUNTER — Encounter: Payer: Self-pay | Admitting: Pharmacy Technician

## 2024-03-23 VITALS — BP 138/90 | Temp 98.1°F | Ht 67.0 in | Wt 245.6 lb

## 2024-03-23 DIAGNOSIS — Z6838 Body mass index (BMI) 38.0-38.9, adult: Secondary | ICD-10-CM

## 2024-03-23 DIAGNOSIS — L219 Seborrheic dermatitis, unspecified: Secondary | ICD-10-CM | POA: Diagnosis not present

## 2024-03-23 DIAGNOSIS — F4312 Post-traumatic stress disorder, chronic: Secondary | ICD-10-CM | POA: Diagnosis not present

## 2024-03-23 DIAGNOSIS — E66812 Obesity, class 2: Secondary | ICD-10-CM | POA: Diagnosis not present

## 2024-03-23 MED ORDER — TIRZEPATIDE-WEIGHT MANAGEMENT 2.5 MG/0.5ML ~~LOC~~ SOLN: 2.5000 mg | SUBCUTANEOUS | 0 refills | Status: DC

## 2024-03-23 MED ORDER — ZORYVE 0.3 % EX FOAM: 1.0000 | Freq: Every day | CUTANEOUS | 1 refills | Status: AC

## 2024-03-23 NOTE — Patient Instructions (Addendum)
 Vagal Nerve Theory on youtube  Meditation music and walking  EMDR, brain spotting  Tirzepatide to pharmacy, 2.5mg  once weekly if covered  Phentermine 15mg  to pharmacy if not covered, and topamax 25mg   Glucose Goddess, Jessie Inshauspe  Zoryve once daily  Follow up with me in one month for evaluation of medication effectiveness.

## 2024-03-23 NOTE — Telephone Encounter (Signed)
 ERROR

## 2024-03-23 NOTE — Telephone Encounter (Signed)
 Pharmacy Patient Advocate Encounter  Received notification from [employer] that Prior Authorization for Zepbound 2.5MG /0.5ML pen-injectors  has been APPROVED from 03/23/24 to 09/21/24. Ran test claim, Copay is $24.00. This test claim was processed through [facility]- copay amounts may vary at other pharmacies due to pharmacy/plan contracts, or as the patient moves through the different stages of their insurance plan.   PA #/Case ID/Reference #: [id number]

## 2024-03-23 NOTE — Telephone Encounter (Addendum)
 Pharmacy Patient Advocate Encounter   Received notification from [employer]Meds that prior authorization for Zepbound 2.5MG /0.5ML pen-injectors  is required/requested.   Insurance verification completed.   The patient is insured through [facility].   Per test claim: PA required; PA submitted to above mentioned insurance via Latent Key/confirmation #/EOC [id number] Status is pending

## 2024-03-23 NOTE — Progress Notes (Signed)
 Acute Office Visit  Subjective:     Patient ID: Brandi Davies, female    DOB: 10-Feb-1986, 38 y.o.   MRN: [id number]  No chief complaint on file.   HPI  Discussed the use of AI scribe software for clinical note transcription with the patient, who gave verbal consent to proceed.  History of Present Illness Brandi Davies is a 38 year old female with complex post-traumatic stress disorder who presents for weight management consultation.  Weight management difficulties - Difficulty with weight management attributed to complex post-traumatic stress disorder affecting eating habits - Cycles between periods of successful nutrition tracking and episodes of feeling overwhelmed, leading to unhealthy eating choices - History of significant weight loss only with severe caloric restriction, which is unsustainable - Frustration with lack of progress despite regular physical activity and avoidance of fast food, soda, and desserts - No significant weight loss despite efforts; attributes challenges to stress response and possible genetic factors - Father's focus on her weight has contributed to disordered eating behaviors  Dietary and lifestyle interventions - Attempts various weight loss strategies, including lifestyle modifications and tracking protein and calorie intake - Struggles with consistency due to stressors such as childcare and household responsibilities - Maintains regular physical activity - Avoids fast food, soda, and desserts  Pharmacologic weight loss interventions - No prior use of phentermine or Topamax for weight loss - Exploring medication options for weight management, including compounded medications due to insurance coverage limitations  Sleep-related symptoms - No diagnosis of sleep apnea - No significant snoring  Psychological and behavioral health - Complex post-traumatic stress disorder impacting eating behaviors and weight management - Engaged in therapy twice a month  and on medication management for mental health - History of EMDR therapy, but difficulty with consistent engagement due to dysregulation - Exploring strategies to better prepare for therapy sessions     ROS Per HPI      Objective:    BP (!) 138/90 (BP Location: Left Arm, Patient Position: Sitting)   Temp 98.1 F (36.7 C) (Temporal)   Ht 5' 7 (1.702 m)   Wt 245 lb 9.6 oz (111.4 kg)   SpO2 97%   BMI 38.47 kg/m    Physical Exam Vitals and nursing note reviewed.  Constitutional:      General: She is not in acute distress.    Appearance: Normal appearance. She is obese.  HENT:     Head: Normocephalic and atraumatic.     Right Ear: External ear normal.     Left Ear: External ear normal.     Nose: Nose normal.     Mouth/Throat:     Mouth: Mucous membranes are moist.     Pharynx: Oropharynx is clear.  Eyes:     Extraocular Movements: Extraocular movements intact.     Pupils: Pupils are equal, round, and reactive to light.  Cardiovascular:     Rate and Rhythm: Normal rate and regular rhythm.     Pulses: Normal pulses.     Heart sounds: Normal heart sounds.  Pulmonary:     Effort: Pulmonary effort is normal. No respiratory distress.     Breath sounds: Normal breath sounds. No wheezing, rhonchi or rales.  Musculoskeletal:        General: Normal range of motion.     Cervical back: Normal range of motion.     Right lower leg: No edema.     Left lower leg: No edema.  Lymphadenopathy:     Cervical: No cervical  adenopathy.  Skin:    Comments: Crown of scalp erythematous, with white flaky patches.  No bleeding, no discharge  Neurological:     General: No focal deficit present.     Mental Status: She is alert and oriented to person, place, and time.  Psychiatric:        Mood and Affect: Mood normal.        Thought Content: Thought content normal.     No results found for any visits on 03/23/24.      Assessment & Plan:   Assessment and Plan Assessment &  Plan Class II severe obesity due to excess calories with serious comorbidity Comorbid conditions include PTSD, psoriasis Obesity management focused on lifestyle changes and medication options. Insurance coverage for weight loss medications is limited. Discussed potential side effects of medications. - Attempt to obtain insurance coverage for Zepbound (tirzepatide) for weight loss. - If Zepbound is not covered, initiate phentermine at half dose to assess for side effects. - Consider combination therapy with phentermine and Topamax if needed. - Schedule follow-up appointment in one month to assess progress and adjust treatment plan as necessary. - Encourage consumption of whole foods and reduction of processed foods.  Scalp psoriasis Scalp psoriasis improving but current treatment causing dryness. - Samples of zoryve given today  Complex posttraumatic stress disorder (C-PTSD) C-PTSD management focused on therapy and medication. Discussed challenges with dysregulation affecting EMDR therapy and strategies to prepare for sessions. Emphasized importance of therapy and medication management. Discussed potential benefits of brainspotting therapy. - Encourage pre-session relaxation techniques such as meditation and walking to prepare for EMDR therapy. - Continue current therapy sessions twice a month. - Consider brainspotting therapy as an additional option.     No orders of the defined types were placed in this encounter.    Meds ordered this encounter  Medications   tirzepatide (ZEPBOUND) 2.5 MG/0.5ML injection vial    Sig: Inject 2.5 mg into the skin once a week.    Dispense:  2 mL    Refill:  0   Roflumilast (ZORYVE) 0.3 % FOAM    Sig: Apply 1 Application topically daily.    Dispense:  60 g    Refill:  1    Return in about 4 weeks (around 04/20/2024) for meds eval.  Corean LITTIE Ku, FNP

## 2024-03-24 ENCOUNTER — Other Ambulatory Visit (HOSPITAL_COMMUNITY): Payer: Self-pay

## 2024-03-25 ENCOUNTER — Other Ambulatory Visit: Payer: Self-pay

## 2024-03-26 ENCOUNTER — Telehealth: Payer: Self-pay | Admitting: Family Medicine

## 2024-03-26 ENCOUNTER — Telehealth: Payer: Self-pay

## 2024-03-26 NOTE — Telephone Encounter (Signed)
 Copied from CRM #[id number]. Topic: Clinical - Lab/Test Results >> Mar 26, 2024 10:19 AM Dedra B wrote: Reason for CRM: Pt wanted to inform her PCP that she will be faxing over a [employer] physician results form that she needs filled out in lieu of biometric screening for her job.

## 2024-03-26 NOTE — Telephone Encounter (Signed)
 Patient dropped off document Physicians Results Form, to be filled out by provider. Patient requested to send it back via Call Patient to pick up within 7-days. Document is located in providers tray at front office.Please advise at [phone]

## 2024-03-29 ENCOUNTER — Ambulatory Visit (INDEPENDENT_AMBULATORY_CARE_PROVIDER_SITE_OTHER): Admitting: Family Medicine

## 2024-03-29 NOTE — Telephone Encounter (Signed)
 Noted

## 2024-03-30 NOTE — Telephone Encounter (Signed)
 Form completed, LVM making patient aware.

## 2024-03-31 NOTE — Telephone Encounter (Signed)
LVM for patient,second attempt

## 2024-04-04 ENCOUNTER — Other Ambulatory Visit (INDEPENDENT_AMBULATORY_CARE_PROVIDER_SITE_OTHER): Payer: Self-pay | Admitting: Family Medicine

## 2024-04-04 DIAGNOSIS — E559 Vitamin D deficiency, unspecified: Secondary | ICD-10-CM

## 2024-04-14 ENCOUNTER — Encounter: Payer: Self-pay | Admitting: Family Medicine

## 2024-04-15 ENCOUNTER — Other Ambulatory Visit (HOSPITAL_COMMUNITY): Payer: Self-pay

## 2024-04-15 ENCOUNTER — Telehealth: Payer: Self-pay

## 2024-04-15 ENCOUNTER — Other Ambulatory Visit: Payer: Self-pay

## 2024-04-15 DIAGNOSIS — L219 Seborrheic dermatitis, unspecified: Secondary | ICD-10-CM

## 2024-04-15 MED ORDER — KETOCONAZOLE 2 % EX SHAM: 1.0000 | MEDICATED_SHAMPOO | CUTANEOUS | 0 refills | Status: AC

## 2024-04-15 NOTE — Telephone Encounter (Signed)
 Pharmacy Patient Advocate Encounter   Received notification from Physician's Office that prior authorization for Zoryve 0.3% foam  is required/requested.   Insurance verification completed.   The patient is insured through [facility].   Per test claim: PA required; PA submitted to above mentioned insurance via Latent Key/confirmation #/EOC [id number] Status is pending

## 2024-04-15 NOTE — Telephone Encounter (Signed)
 PA request has been Started. New Encounter has been or will be created for follow up. For additional info see Pharmacy Prior Auth telephone encounter from 04/15/2024.

## 2024-04-20 ENCOUNTER — Ambulatory Visit: Admitting: Family Medicine

## 2024-04-20 ENCOUNTER — Ambulatory Visit: Payer: Self-pay | Admitting: Family Medicine

## 2024-04-20 ENCOUNTER — Encounter: Payer: Self-pay | Admitting: Family Medicine

## 2024-04-20 VITALS — BP 124/82 | HR 71 | Temp 97.8°F | Ht 67.0 in | Wt 239.0 lb

## 2024-04-20 DIAGNOSIS — R5383 Other fatigue: Secondary | ICD-10-CM | POA: Diagnosis not present

## 2024-04-20 DIAGNOSIS — Z79899 Other long term (current) drug therapy: Secondary | ICD-10-CM

## 2024-04-20 DIAGNOSIS — E66812 Obesity, class 2: Secondary | ICD-10-CM

## 2024-04-20 DIAGNOSIS — Z6838 Body mass index (BMI) 38.0-38.9, adult: Secondary | ICD-10-CM

## 2024-04-20 DIAGNOSIS — K5900 Constipation, unspecified: Secondary | ICD-10-CM

## 2024-04-20 DIAGNOSIS — L409 Psoriasis, unspecified: Secondary | ICD-10-CM

## 2024-04-20 DIAGNOSIS — R4189 Other symptoms and signs involving cognitive functions and awareness: Secondary | ICD-10-CM

## 2024-04-20 LAB — COMPREHENSIVE METABOLIC PANEL WITH GFR
ALT: 50 U/L — ABNORMAL HIGH (ref 0–35)
AST: 26 U/L (ref 0–37)
Albumin: 4.5 g/dL (ref 3.5–5.2)
Alkaline Phosphatase: 40 U/L (ref 39–117)
BUN: 16 mg/dL (ref 6–23)
CO2: 24 meq/L (ref 19–32)
Calcium: 9.4 mg/dL (ref 8.4–10.5)
Chloride: 107 meq/L (ref 96–112)
Creatinine, Ser: 0.88 mg/dL (ref 0.40–1.20)
GFR: 84.76 mL/min (ref >60.00)
Glucose, Bld: 84 mg/dL (ref 70–99)
Potassium: 4.4 meq/L (ref 3.5–5.1)
Sodium: 141 meq/L (ref 135–145)
Total Bilirubin: 0.3 mg/dL (ref 0.2–1.2)
Total Protein: 7.4 g/dL (ref 6.0–8.3)

## 2024-04-20 LAB — TSH: TSH: 1.15 u[IU]/mL (ref 0.35–5.50)

## 2024-04-20 LAB — CBC WITH DIFFERENTIAL/PLATELET
Basophils Absolute: 0 K/uL (ref 0.0–0.1)
Basophils Relative: 0.4 % (ref 0.0–3.0)
Eosinophils Absolute: 0.1 K/uL (ref 0.0–0.7)
Eosinophils Relative: 1.6 % (ref 0.0–5.0)
HCT: 41.7 % (ref 36.0–46.0)
Hemoglobin: 14.3 g/dL (ref 12.0–15.0)
Lymphocytes Relative: 31.8 % (ref 12.0–46.0)
Lymphs Abs: 2 K/uL (ref 0.7–4.0)
MCHC: 34.3 g/dL (ref 30.0–36.0)
MCV: 89.9 fl (ref 78.0–100.0)
Monocytes Absolute: 0.4 K/uL (ref 0.1–1.0)
Monocytes Relative: 6.8 % (ref 3.0–12.0)
Neutro Abs: 3.7 K/uL (ref 1.4–7.7)
Neutrophils Relative %: 59.4 % (ref 43.0–77.0)
Platelets: 193 K/uL (ref 150.0–400.0)
RBC: 4.64 Mil/uL (ref 3.87–5.11)
RDW: 13.3 % (ref 11.5–15.5)
WBC: 6.2 K/uL (ref 4.0–10.5)

## 2024-04-20 LAB — VITAMIN B12: Vitamin B-12: 500 pg/mL (ref 211–911)

## 2024-04-20 LAB — VITAMIN D 25 HYDROXY (VIT D DEFICIENCY, FRACTURES): VITD: 31.82 ng/mL (ref 30.00–100.00)

## 2024-04-20 MED ORDER — TIRZEPATIDE-WEIGHT MANAGEMENT 2.5 MG/0.5ML ~~LOC~~ SOLN: 2.5000 mg | SUBCUTANEOUS | 0 refills | Status: DC

## 2024-04-20 NOTE — Patient Instructions (Addendum)
 1/2 teaspoon Celtic sea salt, frozen fruit of your choice for potassium, magnesium , and electrolytes.  Work on getting in enough calories.  We are checking labs today, will be in contact with any results that require further attention.  CARBS ARE BRAIN FOOD!!!!  Follow up with me in one month for evaluation of medication effectiveness.

## 2024-04-20 NOTE — Progress Notes (Signed)
 Established Patient Office Visit  Subjective:     Patient ID: Brandi Davies, female    DOB: 1986/03/05, 38 y.o.   MRN: [id number]  No chief complaint on file.   HPI  Discussed the use of AI scribe software for clinical note transcription with the patient, who gave verbal consent to proceed.  History of Present Illness Brandi Davies is a 38 year old female who presents with fatigue and brain fog. She is accompanied by her daughter, Brandi Davies.  Fatigue and cognitive dysfunction - Significant fatigue and brain fog, described as 'serious fatigue' and 'a scattered mess' - Symptoms have worsened recently - Fatigue persists despite adequate sleep - Lacks motivation for physical activities - ADD medication is not effective, attributed to brain fog - Brain fog improves with better hydration, particularly with electrolyte-rich water   Dietary habits and appetite - Morning intake includes three eggs, keto yogurt, and Fairlife protein shakes, totaling approximately 73 grams of protein - Snacks include oatmeal - Lunch typically consists of protein-rich foods such as chicken or meatballs - Decreased appetite by dinner, often skips the meal - Struggles with perfectionism, which affects eating habits  Hydration and gastrointestinal symptoms - Difficulty maintaining adequate hydration - Constipation occurs with low water  intake - Adequate hydration leads to multiple bowel movements daily - Tongue swelling occurs with water  flavoring powders - [place] PH waters with electrolytes alleviate symptoms, including headaches - Urine is darker in color - On a bad day, water  intake is 64 to 90 ounces; on a good day, up to 120 ounces  Psychological factors - Struggles with perfectionism, impacting self-care and eating habits - Working with therapist on perfectionism and self-care     ROS Per HPI      Objective:    BP 124/82 (BP Location: Left Arm, Patient Position: Sitting)   Pulse 71   Temp 97.8  F (36.6 C) (Temporal)   Ht 5' 7 (1.702 m)   Wt 239 lb (108.4 kg)   SpO2 97%   BMI 37.43 kg/m    Physical Exam Vitals and nursing note reviewed.  Constitutional:      General: She is not in acute distress.    Appearance: Normal appearance. She is obese.  HENT:     Head: Normocephalic and atraumatic.     Right Ear: External ear normal.     Left Ear: External ear normal.     Nose: Nose normal.     Mouth/Throat:     Mouth: Mucous membranes are moist.     Pharynx: Oropharynx is clear.  Eyes:     Extraocular Movements: Extraocular movements intact.     Pupils: Pupils are equal, round, and reactive to light.  Cardiovascular:     Rate and Rhythm: Normal rate and regular rhythm.     Pulses: Normal pulses.     Heart sounds: Normal heart sounds.  Pulmonary:     Effort: Pulmonary effort is normal. No respiratory distress.     Breath sounds: Normal breath sounds. No wheezing, rhonchi or rales.  Musculoskeletal:        General: Normal range of motion.     Cervical back: Normal range of motion.     Right lower leg: No edema.     Left lower leg: No edema.  Lymphadenopathy:     Cervical: No cervical adenopathy.  Neurological:     General: No focal deficit present.     Mental Status: She is alert and oriented to person, place, and time.  Psychiatric:        Mood and Affect: Mood normal.        Thought Content: Thought content normal.     No results found for any visits on 04/20/24.    BP Readings from Last 3 Encounters:  04/20/24 124/82  03/23/24 (!) 138/90  02/24/24 132/86   Wt Readings from Last 3 Encounters:  04/20/24 239 lb (108.4 kg)  03/23/24 245 lb 9.6 oz (111.4 kg)  02/24/24 246 lb 3.2 oz (111.7 kg)      Last CBC Lab Results  Component Value Date   WBC 6.6 02/24/2024   HGB 14.9 02/24/2024   HCT 44.5 02/24/2024   MCV 91.2 02/24/2024   MCH 31.2 08/05/2023   RDW 13.4 02/24/2024   PLT 237.0 02/24/2024   Last metabolic panel Lab Results  Component Value  Date   GLUCOSE 113 (H) 02/24/2024   NA 138 02/24/2024   K 4.5 02/24/2024   CL 101 02/24/2024   CO2 26 02/24/2024   BUN 19 02/24/2024   CREATININE 0.90 02/24/2024   GFR 82.60 02/24/2024   CALCIUM  9.9 02/24/2024   PROT 7.7 02/24/2024   ALBUMIN 4.8 02/24/2024   LABGLOB 2.5 12/29/2023   AGRATIO 1.9 08/19/2019   BILITOT 0.5 02/24/2024   ALKPHOS 32 (L) 02/24/2024   AST 21 02/24/2024   ALT 36 (H) 02/24/2024   ANIONGAP 8 01/19/2021   Last vitamin D  Lab Results  Component Value Date   VD25OH 37.27 02/24/2024   Last vitamin B12 and Folate Lab Results  Component Value Date   VITAMINB12 403 02/24/2024   FOLATE 9.8 08/05/2023         Assessment & Plan:   Assessment and Plan Assessment & Plan Class II severe obesity due to excess calories with serious comorbidity, high risk medication use Fatigue, brain fog, and constipation potentially linked to medication and dietary habits. Emphasized hydration, balanced nutrition, and electrolyte balance, especially with GLP-1 agonists. - Ordered labs for kidney and liver function. - Encouraged 120 oz daily fluid intake with electrolytes. - Advised homemade electrolyte solutions with Celtic sea salt and frozen fruit. - Recommended protein shakes for dinner if unable to eat solid food. - Continue Zepbound at 2.5mg  once weekly. - Reassess in one month.  Fatigue and brain fog Potentially due to inadequate hydration, electrolyte imbalance, and restrictive eating. Improvement noted with increased hydration and electrolytes. - Encouraged increased fluid intake with electrolytes. - Advised balanced nutrition including carbohydrates. - Reassess in one month.  Constipation Potentially related to inadequate hydration and dietary habits. Improvement noted with increased water  intake. - Encouraged increased fluid intake with electrolytes. - Advised balanced nutrition for bowel regularity. - Reassess in one month.  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     Orders Placed This Encounter  Procedures   CBC with Differential/Platelet    Release to patient:   Immediate [1]   Comprehensive metabolic panel with GFR    Release to patient:   Immediate [1]   VITAMIN D  25 Hydroxy (Vit-D Deficiency, Fractures)   Vitamin B12   TSH     Meds ordered this encounter  Medications   tirzepatide (ZEPBOUND) 2.5 MG/0.5ML injection vial    Sig: Inject 2.5 mg into the skin once a week.    Dispense:  2 mL    Refill:  0    Return in about 4 weeks (around 05/18/2024) for meds OV.  Brandi LITTIE Ku, FNP

## 2024-04-22 ENCOUNTER — Other Ambulatory Visit (HOSPITAL_COMMUNITY): Payer: Self-pay

## 2024-04-22 NOTE — Telephone Encounter (Signed)
 Pharmacy Patient Advocate Encounter  Received notification from [facility] that Prior Authorization for Zoryve 0.3% foam  has been APPROVED from 04/14/2024 to 04/14/2025. Unable to obtain price due to refill too soon rejection, last fill date 04/19/2024 next available fill date 05/12/2024   PA #/Case ID/Reference #: [id number]

## 2024-04-23 ENCOUNTER — Ambulatory Visit: Admitting: Family Medicine

## 2024-04-29 ENCOUNTER — Other Ambulatory Visit: Payer: Self-pay | Admitting: Family

## 2024-04-29 ENCOUNTER — Encounter: Payer: Self-pay | Admitting: Family Medicine

## 2024-04-29 MED ORDER — OMEPRAZOLE 20 MG PO CPDR
20.0000 mg | DELAYED_RELEASE_CAPSULE | Freq: Every day | ORAL | 3 refills | Status: AC
Start: 2024-04-29 — End: ?

## 2024-05-20 ENCOUNTER — Ambulatory Visit: Admitting: Family Medicine

## 2024-05-20 ENCOUNTER — Encounter: Payer: Self-pay | Admitting: Family Medicine

## 2024-05-20 VITALS — BP 128/92 | HR 95 | Temp 98.3°F | Ht 67.0 in | Wt 241.6 lb

## 2024-05-20 DIAGNOSIS — L409 Psoriasis, unspecified: Secondary | ICD-10-CM

## 2024-05-20 DIAGNOSIS — F4312 Post-traumatic stress disorder, chronic: Secondary | ICD-10-CM

## 2024-05-20 DIAGNOSIS — E66812 Obesity, class 2: Secondary | ICD-10-CM

## 2024-05-20 DIAGNOSIS — B9689 Other specified bacterial agents as the cause of diseases classified elsewhere: Secondary | ICD-10-CM

## 2024-05-20 MED ORDER — ZEPBOUND 5 MG/0.5ML ~~LOC~~ SOAJ: 5.0000 mg | SUBCUTANEOUS | 0 refills | Status: DC

## 2024-05-20 MED ORDER — TIRZEPATIDE 5 MG/0.5ML ~~LOC~~ SOAJ: 5.0000 mg | SUBCUTANEOUS | 1 refills | Status: DC

## 2024-05-20 MED ORDER — AZITHROMYCIN 250 MG PO TABS: ORAL_TABLET | ORAL | 0 refills | Status: AC

## 2024-05-20 NOTE — Patient Instructions (Signed)
 Vagus Nerve Reset (book), EMDR and brainspotting  Continue therapy  INCREASE tirzepatide  to 5mg  once weekly  I have sent in azithromycin for you to take. Take 2 tablets today, then 1 tablet daily for the next 4 days.  Follow up with me in about 3 months for labs and medication management, sooner if needed.

## 2024-05-20 NOTE — Progress Notes (Unsigned)
 Established Patient Office Visit  Subjective:     Patient ID: Brandi Davies, female    DOB: [birth year], 38 y.o.   MRN: [id number]  No chief complaint on file.   HPI  Discussed the use of AI scribe software for clinical note transcription with the patient, who gave verbal consent to proceed.  History of Present Illness Brandi Davies is a 38 year old female who presents with a persistent cough and hormonal acne.  Productive cough and upper respiratory symptoms - Productive cough for approximately 6 weeks - Yellow sputum production - Associated post-nasal drip - Sensation of a lump in the throat - Occasional difficulty expectorating phlegm - No fever - Received steroids at urgent care without improvement  Gastrointestinal symptoms - Diarrhea and mild nausea for 2 days  Hormonal acne - Onset of acne on face and chest approximately 3 weeks ago - Worsening of acne over the past 2 weeks  Psychiatric history - Borderline personality disorder and complex PTSD - Engaged in ongoing therapy, including EMDR  Medication administration preferences - Currently using Zepbound  pens for treatment - Prefers pens over vials  Allergies - Allergic to sulfa antibiotics     ROS Per HPI      Objective:    BP (!) 128/92 (BP Location: Left Arm, Patient Position: Sitting)   Pulse 95   Temp 98.3 F (36.8 C) (Temporal)   Ht 5' 7 (1.702 m)   Wt 241 lb 9.6 oz (109.6 kg)   SpO2 97%   BMI 37.84 kg/m    Physical Exam Vitals and nursing note reviewed.  Constitutional:      General: She is not in acute distress.    Comments: Appears fatigued  HENT:     Head: Normocephalic and atraumatic.     Right Ear: External ear normal.     Left Ear: External ear normal.     Nose: Congestion present.     Mouth/Throat:     Mouth: Mucous membranes are moist.     Comments: Oropharyngeal cobblestoning   Eyes:     Extraocular Movements: Extraocular movements intact.     Pupils: Pupils are equal,  round, and reactive to light.  Cardiovascular:     Rate and Rhythm: Normal rate and regular rhythm.     Pulses: Normal pulses.     Heart sounds: Normal heart sounds.  Pulmonary:     Effort: Pulmonary effort is normal. No respiratory distress.     Breath sounds: Normal breath sounds. No wheezing, rhonchi or rales.  Musculoskeletal:        General: Normal range of motion.     Cervical back: Normal range of motion.     Right lower leg: No edema.     Left lower leg: No edema.  Lymphadenopathy:     Cervical: Cervical adenopathy present.  Neurological:     General: No focal deficit present.     Mental Status: She is alert and oriented to person, place, and time.  Psychiatric:        Mood and Affect: Mood normal.        Thought Content: Thought content normal.     No results found for any visits on 05/20/24.    BP Readings from Last 3 Encounters:  05/20/24 (!) 128/92  04/20/24 124/82  03/23/24 (!) 138/90   Wt Readings from Last 3 Encounters:  05/20/24 241 lb 9.6 oz (109.6 kg)  04/20/24 239 lb (108.4 kg)  03/23/24 245 lb 9.6 oz (111.4 kg)  Last CBC Lab Results  Component Value Date   WBC 6.2 04/20/2024   HGB 14.3 04/20/2024   HCT 41.7 04/20/2024   MCV 89.9 04/20/2024   MCH 31.2 08/05/2023   RDW 13.3 04/20/2024   PLT 193.0 04/20/2024   Last metabolic panel Lab Results  Component Value Date   GLUCOSE 84 04/20/2024   NA 141 04/20/2024   K 4.4 04/20/2024   CL 107 04/20/2024   CO2 24 04/20/2024   BUN 16 04/20/2024   CREATININE 0.88 04/20/2024   GFR 84.76 04/20/2024   CALCIUM  9.4 04/20/2024   PROT 7.4 04/20/2024   ALBUMIN 4.5 04/20/2024   LABGLOB 2.5 12/29/2023   AGRATIO 1.9 08/19/2019   BILITOT 0.3 04/20/2024   ALKPHOS 40 04/20/2024   AST 26 04/20/2024   ALT 50 (H) 04/20/2024   ANIONGAP 8 01/19/2021   Last lipids Lab Results  Component Value Date   CHOL 163 02/24/2024   HDL 54.60 02/24/2024   LDLCALC 91 02/24/2024   TRIG 90.0 02/24/2024   CHOLHDL  3 02/24/2024   Last hemoglobin A1c Lab Results  Component Value Date   HGBA1C 6.1 02/24/2024   Last thyroid functions Lab Results  Component Value Date   TSH 1.15 04/20/2024   FREET4 0.98 08/05/2023   Last vitamin D  Lab Results  Component Value Date   VD25OH 31.82 04/20/2024   Last vitamin B12 and Folate Lab Results  Component Value Date   VITAMINB12 500 04/20/2024   FOLATE 9.8 08/05/2023         Assessment & Plan:   Assessment and Plan Assessment & Plan Bacterial sinusitis Persistent cough and congestion. Sulfa allergy noted. - Prescribed azithromycin  (Z-Pak). - Advised follow-up if no improvement by Monday.  Class II obesity due to excess calories with serious comorbidity - Comorbid Borderline Personality Disorder, psoriasis On Zepbound  2.5 mg weekly. Reports hormonal acne and scalp issues. Prefers pen formulation. - Continue Zepbound  2.5 mg subcutaneous once weekly. - Sent prescription for Zepbound  pens to pharmacy.  Scalp psoriasis Reports scalp issues resolving with use of tirzepatide   Borderline personality disorder Engaged in therapy, exploring EMDR, DBT, and vagus nerve stimulation. Acknowledges childhood trauma impact. - Continue therapy and explore DBT and vagus nerve stimulation techniques.  Chronic post-traumatic stress disorder Engaged in EMDR therapy. Reports challenges with triggers, especially from family dynamics. - Continue EMDR therapy. - Encouraged use of emotional regulation techniques.     No orders of the defined types were placed in this encounter.    Meds ordered this encounter  Medications   DISCONTD: tirzepatide  (MOUNJARO ) 5 MG/0.5ML Pen    Sig: Inject 5 mg into the skin once a week.    Dispense:  2 mL    Refill:  1   azithromycin  (ZITHROMAX ) 250 MG tablet    Sig: Take 2 tablets on day 1, then 1 tablet daily on days 2 through 5    Dispense:  6 tablet    Refill:  0   tirzepatide  (ZEPBOUND ) 5 MG/0.5ML Pen    Sig: Inject 5  mg into the skin once a week.    Dispense:  2 mL    Refill:  0    Return in about 3 months (around 08/18/2024) for meds OV.  Corean LITTIE Ku, FNP

## 2024-05-23 DIAGNOSIS — F603 Borderline personality disorder: Secondary | ICD-10-CM | POA: Insufficient documentation

## 2024-05-24 ENCOUNTER — Other Ambulatory Visit (HOSPITAL_COMMUNITY): Payer: Self-pay

## 2024-06-08 ENCOUNTER — Other Ambulatory Visit: Payer: Self-pay

## 2024-06-08 DIAGNOSIS — E66812 Obesity, class 2: Secondary | ICD-10-CM

## 2024-06-08 MED ORDER — ZEPBOUND 5 MG/0.5ML ~~LOC~~ SOAJ: 5.0000 mg | SUBCUTANEOUS | 0 refills | Status: DC

## 2024-06-16 ENCOUNTER — Encounter: Payer: Self-pay | Admitting: Family Medicine

## 2024-06-17 ENCOUNTER — Other Ambulatory Visit (HOSPITAL_COMMUNITY): Payer: Self-pay

## 2024-06-17 NOTE — Telephone Encounter (Signed)
 Requesting PA for Zepbound

## 2024-06-17 NOTE — Telephone Encounter (Signed)
 Is she ok to stay on the 5 mg or need to go back down to the 2.5 mg since she missed 2 doses? (Last dose was on xmas)

## 2024-07-08 ENCOUNTER — Other Ambulatory Visit: Payer: Self-pay | Admitting: Family Medicine

## 2024-07-08 DIAGNOSIS — Z6838 Body mass index (BMI) 38.0-38.9, adult: Secondary | ICD-10-CM

## 2024-08-19 ENCOUNTER — Ambulatory Visit: Admitting: Family Medicine
